# Patient Record
Sex: Female | Born: 1965 | Race: White | Hispanic: No | Marital: Married | Smoking: Never smoker
Health system: Southern US, Community
[De-identification: ages and names within clinical notes are randomized; demographics above are authoritative.]

## PROBLEM LIST (undated history)

## (undated) DIAGNOSIS — E7800 Pure hypercholesterolemia, unspecified: Secondary | ICD-10-CM

## (undated) DIAGNOSIS — M8580 Other specified disorders of bone density and structure, unspecified site: Secondary | ICD-10-CM

## (undated) HISTORY — DX: Other specified disorders of bone density and structure, unspecified site: M85.80

## (undated) HISTORY — PX: HEMORRHOIDECTOMY WITH HEMORRHOID BANDING: SHX5633

## (undated) HISTORY — DX: Pure hypercholesterolemia, unspecified: E78.00

## (undated) HISTORY — PX: HYSTEROSCOPY: SHX211

## (undated) HISTORY — PX: DILATION AND CURETTAGE OF UTERUS: SHX78

---

## 1997-12-23 ENCOUNTER — Ambulatory Visit (HOSPITAL_COMMUNITY): Admission: RE | Admit: 1997-12-23 | Discharge: 1997-12-23 | Payer: Self-pay | Admitting: *Deleted

## 1998-08-11 ENCOUNTER — Other Ambulatory Visit: Admission: RE | Admit: 1998-08-11 | Discharge: 1998-08-11 | Payer: Self-pay | Admitting: *Deleted

## 1998-09-29 ENCOUNTER — Ambulatory Visit (HOSPITAL_COMMUNITY): Admission: RE | Admit: 1998-09-29 | Discharge: 1998-09-29 | Payer: Self-pay | Admitting: *Deleted

## 1998-12-08 ENCOUNTER — Other Ambulatory Visit: Admission: RE | Admit: 1998-12-08 | Discharge: 1998-12-08 | Payer: Self-pay | Admitting: *Deleted

## 1999-03-23 ENCOUNTER — Other Ambulatory Visit: Admission: RE | Admit: 1999-03-23 | Discharge: 1999-03-23 | Payer: Self-pay | Admitting: *Deleted

## 1999-06-26 ENCOUNTER — Other Ambulatory Visit: Admission: RE | Admit: 1999-06-26 | Discharge: 1999-06-26 | Payer: Self-pay | Admitting: *Deleted

## 1999-06-26 ENCOUNTER — Encounter (INDEPENDENT_AMBULATORY_CARE_PROVIDER_SITE_OTHER): Payer: Self-pay

## 1999-11-20 ENCOUNTER — Other Ambulatory Visit: Admission: RE | Admit: 1999-11-20 | Discharge: 1999-11-20 | Payer: Self-pay | Admitting: *Deleted

## 2000-03-27 ENCOUNTER — Other Ambulatory Visit: Admission: RE | Admit: 2000-03-27 | Discharge: 2000-03-27 | Payer: Self-pay | Admitting: *Deleted

## 2000-04-04 ENCOUNTER — Encounter: Admission: RE | Admit: 2000-04-04 | Discharge: 2000-04-04 | Payer: Self-pay | Admitting: *Deleted

## 2000-04-04 ENCOUNTER — Encounter: Payer: Self-pay | Admitting: *Deleted

## 2000-07-10 ENCOUNTER — Other Ambulatory Visit: Admission: RE | Admit: 2000-07-10 | Discharge: 2000-07-10 | Payer: Self-pay | Admitting: *Deleted

## 2001-04-09 ENCOUNTER — Other Ambulatory Visit: Admission: RE | Admit: 2001-04-09 | Discharge: 2001-04-09 | Payer: Self-pay | Admitting: *Deleted

## 2001-07-29 ENCOUNTER — Other Ambulatory Visit: Admission: RE | Admit: 2001-07-29 | Discharge: 2001-07-29 | Payer: Self-pay | Admitting: *Deleted

## 2002-04-19 ENCOUNTER — Other Ambulatory Visit: Admission: RE | Admit: 2002-04-19 | Discharge: 2002-04-19 | Payer: Self-pay | Admitting: Obstetrics and Gynecology

## 2002-08-27 ENCOUNTER — Encounter (INDEPENDENT_AMBULATORY_CARE_PROVIDER_SITE_OTHER): Payer: Self-pay

## 2002-08-27 ENCOUNTER — Ambulatory Visit (HOSPITAL_COMMUNITY): Admission: RE | Admit: 2002-08-27 | Discharge: 2002-08-27 | Payer: Self-pay | Admitting: Obstetrics & Gynecology

## 2002-11-19 ENCOUNTER — Ambulatory Visit (HOSPITAL_COMMUNITY): Admission: RE | Admit: 2002-11-19 | Discharge: 2002-11-19 | Payer: Self-pay | Admitting: Obstetrics & Gynecology

## 2003-04-21 ENCOUNTER — Other Ambulatory Visit: Admission: RE | Admit: 2003-04-21 | Discharge: 2003-04-21 | Payer: Self-pay | Admitting: Obstetrics & Gynecology

## 2003-05-20 ENCOUNTER — Other Ambulatory Visit: Admission: RE | Admit: 2003-05-20 | Discharge: 2003-05-20 | Payer: Self-pay | Admitting: Obstetrics & Gynecology

## 2003-07-31 ENCOUNTER — Emergency Department (HOSPITAL_COMMUNITY): Admission: EM | Admit: 2003-07-31 | Discharge: 2003-07-31 | Payer: Self-pay | Admitting: Emergency Medicine

## 2004-01-10 ENCOUNTER — Encounter: Admission: RE | Admit: 2004-01-10 | Discharge: 2004-01-10 | Payer: Self-pay | Admitting: Family Medicine

## 2004-04-17 ENCOUNTER — Encounter: Admission: RE | Admit: 2004-04-17 | Discharge: 2004-04-17 | Payer: Self-pay | Admitting: Obstetrics & Gynecology

## 2005-05-16 ENCOUNTER — Encounter: Admission: RE | Admit: 2005-05-16 | Discharge: 2005-05-16 | Payer: Self-pay | Admitting: Obstetrics & Gynecology

## 2006-05-20 ENCOUNTER — Encounter: Admission: RE | Admit: 2006-05-20 | Discharge: 2006-05-20 | Payer: Self-pay | Admitting: Obstetrics & Gynecology

## 2007-05-25 ENCOUNTER — Encounter: Admission: RE | Admit: 2007-05-25 | Discharge: 2007-05-25 | Payer: Self-pay | Admitting: Obstetrics & Gynecology

## 2008-05-31 ENCOUNTER — Encounter: Admission: RE | Admit: 2008-05-31 | Discharge: 2008-05-31 | Payer: Self-pay | Admitting: Obstetrics & Gynecology

## 2008-06-21 ENCOUNTER — Ambulatory Visit: Payer: Self-pay | Admitting: Sports Medicine

## 2008-06-21 DIAGNOSIS — M461 Sacroiliitis, not elsewhere classified: Secondary | ICD-10-CM | POA: Insufficient documentation

## 2009-06-05 ENCOUNTER — Encounter: Admission: RE | Admit: 2009-06-05 | Discharge: 2009-06-05 | Payer: Self-pay | Admitting: Obstetrics & Gynecology

## 2009-09-27 ENCOUNTER — Ambulatory Visit: Payer: Self-pay | Admitting: Sports Medicine

## 2009-09-27 DIAGNOSIS — M5430 Sciatica, unspecified side: Secondary | ICD-10-CM | POA: Insufficient documentation

## 2009-09-27 DIAGNOSIS — M79609 Pain in unspecified limb: Secondary | ICD-10-CM | POA: Insufficient documentation

## 2009-10-26 ENCOUNTER — Ambulatory Visit: Payer: Self-pay | Admitting: Sports Medicine

## 2010-01-10 ENCOUNTER — Ambulatory Visit: Payer: Self-pay | Admitting: Sports Medicine

## 2010-04-12 ENCOUNTER — Ambulatory Visit: Payer: Self-pay | Admitting: Sports Medicine

## 2010-06-07 ENCOUNTER — Encounter
Admission: RE | Admit: 2010-06-07 | Discharge: 2010-06-07 | Payer: Self-pay | Source: Home / Self Care | Attending: Obstetrics & Gynecology | Admitting: Obstetrics & Gynecology

## 2010-08-02 NOTE — Assessment & Plan Note (Signed)
Summary: F/U HAMSTRING,MC   Vital Signs:  Patient profile:   45 year old female Height:      56 inches Weight:      135 pounds BMI:     30.38 BP sitting:   123 / 83  Vitals Entered By: Lillia Pauls CMA (September 27, 2009 11:44 AM)  History of Present Illness: Reports to f/u sacroiliitis, which improved by 80%. Started performing quad extensions and stair climber exercises this past winter. Noted insidious onset of proximal medial left buttock pain overlying proximal HS. Pain occurs with radiation down posterior left leg toward calf. Leg pain not associated with back pain. Regularly performs ITB stretches. Runs 3-5 miles twice weekly.  Pain early during runs, moreson when running uphill, which worsens until she stops. Pain on prolonged sitting and during swimming as well. No incontinence or paresthesias.  Allergies (verified): No Known Drug Allergies  Physical Exam  General:  Well-developed,well-nourished,in no acute distress; alert,appropriate and cooperative throughout examination Msk:  BACK: FROM at the waist w/o pain. No ttp.  BUTTOCKS: Mid left gluteal ttp with similar ttp at sciatic notch. No apparent swelling or defect.  HIPS: No ttp. Full ROM.  Decreased 4/5 ER/abd/add strength on left. (-) FABER on left. Increased left hip flexibility vs last exam.  KNEES/ANKLES/FEET: Full ROM/strength. Reproduced pain on prolonged left hamstring loading.  * Good running form, though slight ER of the left foot. Pulses:  Normal distal pulses. Neurologic:  (-) SLR bilaterally.   Impression & Recommendations:  Problem # 1:  LEG PAIN, LEFT (ICD-729.5) Picture most c/w piriformis syndrome, with underlying hamstring or quadratus femorus tendinopathy as possible contributants.  - Daily crossover stretch, piriformis stretch, hamstring exercises with weights, and lunges as tolerated. - Run as tolerated on level surfaces. Walk along hills. - Start neurontin. Cautioned re:  potential adverse medication effects. - RTC in 1 month.  Problem # 2:  SACROILIITIS NOT ELSEWHERE CLASSIFIED (ICD-720.2) Assessment: Improved  - Per item #1. - Can continue to swim, but avoid butterflies and backstrokes which contributed to hyperextension of the back.  Problem # 3:  SCIATICA, LEFT (ICD-724.3) This is probably 2/2 piriformis unable to be sure if this was high hamnstring, quad femoris or piriformis on exam did not seem related to lumbar pathology  Complete Medication List: 1)  Neurontin 300 Mg Caps (Gabapentin) .Marland Kitchen.. 1 cap by mouth qhs Prescriptions: NEURONTIN 300 MG CAPS (GABAPENTIN) 1 cap by mouth qHS  #30 x 0   Entered and Authorized by:   Valarie Merino MD   Signed by:   Valarie Merino MD on 09/27/2009   Method used:   Print then Give to Patient   RxID:   [id number] NEURONTIN 300 MG CAPS (GABAPENTIN) 1 cap by mouth qHS  #30 x 0   Entered and Authorized by:   Valarie Merino MD   Signed by:   Valarie Merino MD on 09/27/2009   Method used:   Print then Give to Patient   RxID:   [id number]

## 2010-08-02 NOTE — Assessment & Plan Note (Signed)
Summary: F/U,MC   Vital Signs:  Patient profile:   45 year old female Temp:     61 degrees F Pulse rate:   61 / minute BP sitting:   119 / 73  (right arm)  Vitals Entered By: Rochele Pages RN (April 12, 2010 9:35 AM) CC: f/u L hamstring and sciatica   CC:  f/u L hamstring and sciatica.  History of Present Illness: Patient returns to clinic today for follow up on left hamstring pain and sciatica which she reports are 30% improved from her last visit. She still has pain and tingling with prolonged sitting, especially on harder surfaces like movie or car seats. The tingling runs down the lateral side of her leg. She denies numbness.  She has occasional pain with her hamstring with activity. She has resumed swimming and running several times a week without problems, but can no longer do isolated kicking exercises in the pool. She has also continued her stretching and rehab exercises.  She is still on gabapentin twice daily. She tried one day not taking it at night and was uncomfortable the following day.  Allergies: No Known Drug Allergies  Physical Exam  General:  Well-developed,well-nourished,in no acute distress; alert,appropriate and cooperative throughout examination Msk:  Hip: Full ROM with flexion, extension.  5/5 strength bilaterally with flexion, extension, abduction. Mild weakness on L as compared to right with adduction.   FABER improved on R, close to equal bilaterally.  Pretzel stretch with decreased motion at SI joint and tightness on L as compared to R. Neurologic:  Running gait neutral with equal swing of legs.   Impression & Recommendations:  Problem # 1:  SCIATICA, LEFT (ICD-724.3) Sciatica improved. Instructed on pretzel stretch to loosen SI joint on L. Will continue exercises and add adductor exercise on L. Continue gabapentin two times a day, but she will try titrating dose as needed based on activity.  Can reck in 3 to 4 mos as needed  Problem # 2:  LEG  PAIN, LEFT (ICD-729.5) Pain improved and hamstring strength good. Will avoid static stretching before workouts to avoid irritation. Continue exercise and rehab exercises.  Complete Medication List: 1)  Neurontin 300 Mg Caps (Gabapentin) .Marland Kitchen.. 1 cap by mouth tid Prescriptions: NEURONTIN 300 MG CAPS (GABAPENTIN) 1 cap by mouth TID  #90 x 3   Entered and Authorized by:   Enid Baas MD   Signed by:   Enid Baas MD on 04/12/2010   Method used:   Electronically to        [facility] [address] (retail)       [address]       [place], [place]  [address]       Ph: [phone] or [phone]       Fax: [phone]   RxID:   [id number]

## 2010-08-02 NOTE — Assessment & Plan Note (Signed)
Summary: F/U,MC   Vital Signs:  Patient profile:   45 year old female BP sitting:   111 / 67  Vitals Entered By: Lillia Pauls CMA (January 10, 2010 8:47 AM)  History of Present Illness: Running - feels she is doing some better  Pain has improved with 2 gabapentin per day  sensation of pressure over sciatic n when sitting too much  worse p hard w swim workout  feels strong  running 3 to 5 mi 3 to 4 x per wk swims 3 x per wk - 3000 M and intervals  Allergies: No Known Drug Allergies  Physical Exam  General:  Well-developed,well-nourished,in no acute distress; alert,appropriate and cooperative throughout examination Msk:  very tiught on FABER on RT when compared to left good hip rotation  good strength bilat hips good hamstring strength bilat  left - no sciatic sxs on  pressure over HS of quad femoris  neg SLR  running gait shows some turnout of RT foot and less swing and motion of RT SIJ and pelvis   Impression & Recommendations:  Problem # 1:  LEG PAIN, LEFT (ICD-729.5) much less and able to train at fairly high level  OK to consider ART or accupuncture if she wishes to try rolfing did not help in past  Problem # 2:  SCIATICA, LEFT (ICD-724.3) gabapentin has clearly helped would keep this at two times a day since sxs are not that severe x p hard workouts  reck 3 mos  Complete Medication List: 1)  Neurontin 300 Mg Caps (Gabapentin) .Marland Kitchen.. 1 cap by mouth tid  Patient Instructions: 1)  Plan on reck in 3 mos 2)  easy stretch to RT side 3)  keep up other exercises 4)  leave gabaopentin to 2 per day except bad days increase to 3

## 2010-08-02 NOTE — Assessment & Plan Note (Signed)
Summary: F/U HAMSTRING,MC   Vital Signs:  Patient profile:   45 year old female BP sitting:   114 / 70  Vitals Entered By: Lillia Pauls CMA (October 26, 2009 9:56 AM)  History of Present Illness: Follow up of Left hip and sciatic issues  Hamstring and sciatic sxs better w running stronger sitting is still a problem pain in buttocks pain down to HS and sometimes below knee  neurontin no real sideeffects later in day when working/ sits at computer or with travel gets more sciatic sxs  Allergies: No Known Drug Allergies  Physical Exam  General:  Well-developed,well-nourished,in no acute distress; alert,appropriate and cooperative throughout examination Msk:  now has good SIJ mobility on testing  norm HIP ROM goos strength in the core  left leg still feels some discomfort on conc HS strength test at max good at 30 deg and 10 deg  strength is 95% of RT  Piriformis is now non tender on left   Impression & Recommendations:  Problem # 1:  LEG PAIN, LEFT (ICD-729.5) Assessment Improved definitely improved  running steadily better  Problem # 2:  SCIATICA, LEFT (ICD-724.3) Assessment: Improved improved but we should push dose a bit to see if we can get more relief  Problem # 3:  SACROILIITIS NOT ELSEWHERE CLASSIFIED (ICD-720.2) Good SIJ mobility now  still would avoid some of the swim strokes that aggravate  will reck in 2 to 3 mos  Complete Medication List: 1)  Neurontin 300 Mg Caps (Gabapentin) .Marland Kitchen.. 1 cap by mouth tid  Patient Instructions: 1)  With gabapentin try at least two times a day for 1 week 2)  If not bad sideeffects can titrate two times a day to three times a day 3)  keep up at least 1 set of HS exercises but increase range of motion on those 4)  keep up standing hip rotation at least 1 set 5)  pick stretches that help - don't overdo on RT particularly 6)  2 to 3 months return Prescriptions: NEURONTIN 300 MG CAPS (GABAPENTIN) 1 cap by mouth TID   #90 x 3   Entered by:   Lillia Pauls CMA   Authorized by:   Enid Baas MD   Signed by:   Lillia Pauls CMA on 10/26/2009   Method used:   Electronically to        [facility]* (retail)       [address]       [place], [place]  [address]       Ph: [phone]       Fax: [phone]   RxID:   [id number]

## 2010-09-21 ENCOUNTER — Other Ambulatory Visit: Payer: Self-pay | Admitting: Obstetrics & Gynecology

## 2010-10-27 ENCOUNTER — Other Ambulatory Visit: Payer: Self-pay | Admitting: Sports Medicine

## 2010-11-16 NOTE — Op Note (Signed)
   NAME:  Danielle [name], Danielle [name]                        ACCOUNT NO.:  [id number]   MEDICAL RECORD NO.:  [id number]                   PATIENT TYPE:  AMB   LOCATION:  [facility]                                  FACILITY:  [facility]   PHYSICIAN:  Genia Del, M.D.             DATE OF BIRTH:  [birth year]   DATE OF PROCEDURE:  11/19/2002  DATE OF DISCHARGE:                                 OPERATIVE REPORT   PREOPERATIVE DIAGNOSES:  1. Refractory menorrhagia with normal intrauterine cavity.  2. 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   POSTOPERATIVE DIAGNOSES:  1. Refractory menorrhagia with normal intrauterine cavity.  2. 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   PROCEDURE:  Cryoablation of the endometrium with Her Option.   SURGEON:  Genia Del, M.D.   ANESTHESIOLOGIST:  John A. Maisie Fus, M.D.   DESCRIPTION OF PROCEDURE:  Under MAC analgesia, the patient is in lithotomy  position.  She was prepped with Hibiclens in the suprapubic, vulvar, and  vaginal areas and draped as usual.  The vaginal exam revealed an anteverted  uterus, normal volume, mobile, no adnexal mass.  The cervix is normal to  palpation.  The speculum is inserted.  The paracervical block is done with  lidocaine 1% a total of 20 mL at 4 and 8 o'clock.  The tenaculum is applied  on the anterior lip of the cervix.  Dilatation with Hegar dilators is done  easily to a #21.  We then use Her Option to freeze first the left side of  the intrauterine cavity to a depth of 10 cm over eight minutes, reaching a  temperature of -99.  We proceed the same way on the right side over eight  minutes at -99 and then we decide to do a third freeze because of the depth  of the cavity in the midline over six minutes at -99 degrees.  We remove the  probe in between each freeze as per protocol.  The tenaculum is then removed  from the anterior lip of the cervix.  Silver nitrate is applied at that  level to control hemostasis.  A bladder catheterization is done to empty the  bladder at the end of the procedure.  There was minimal blood loss, no  complication occurred, and the patient was transferred to the recovery room  in good status.  Note that the patient prepared her cavity by being on Aleve  prior to the surgery.                                               Genia Del, M.D.    ML/MEDQ  D:  11/19/2002  T:  11/19/2002  Job:  [id number]

## 2010-11-16 NOTE — Op Note (Signed)
   NAME:  [name], Danielle [name]                        ACCOUNT NO.:  [id number]   MEDICAL RECORD NO.:  [id number]                   PATIENT TYPE:  AMB   LOCATION:  [facility]                                  FACILITY:  [facility]   PHYSICIAN:  Genia Del, M.D.             DATE OF BIRTH:  May 26, 1966   DATE OF PROCEDURE:  08/27/2002  DATE OF DISCHARGE:                                 OPERATIVE REPORT   PREOPERATIVE DIAGNOSES:  1. Menorrhagia.  2. Intrauterine lesions per ultrasound.   POSTOPERATIVE DIAGNOSES:  1. Menorrhagia.  2. Intrauterine lesions per ultrasound.  3. Possible endometrial polyp.   INTERVENTION:  1. Hysteroscopy with resection of possible polyp.  2. Dilatation and curettage.   SURGEON:  Genia Del, M.D.   ANESTHESIOLOGIST:  Burnett Corrente, M.D.   PROCEDURE:  Under general anesthesia with endotracheal intubation the  patient is in lithotomy position.  She is prepped with Hibiclens on the  suprapubic, vulvar, and vaginal area.  The bladder is catheterized and the  patient is draped as usual.  The vaginal examination under general  anesthesia reveals an anteverted uterus normal volume, mobile and no adnexal  mass.  The cervix is long and closed.  No bleeding is present.  The weighted  speculum is inserted.  We then grasp the anterior lip of the cervix with a  tenaculum.  A paracervical block is done at 4 and 8 o'clock with a total of  20 mL of lidocaine 1%.  We then proceed with dilatation with Hegar dilators  up to number 37 without difficulty.  The operative hysteroscope is then  introduced inside the uterine cavity.  We visualize the intrauterine cavity  which appears normal except for a thick endometrium and possible anterior  endometrial polyp.  Both ostia are visualized.  Pictures are taken.  Using  the loop we resect the possible polyp on the anterior wall of the  intrauterine cavity and it is sent to pathology.  We then remove the  hysteroscope and proceed  with systematic curettage of the endometrial cavity  with a sharp curette.  Endometrial curettings are removed in moderate  quantity and sent to pathology.  Hemostasis is adequate.  We remove the  clamp on the anterior lip of the cervix, complete hemostasis at that level  with silver nitrate,  and remove the weighted speculum.  The estimated blood  loss was minimal.  No complications occurred and the patient was brought to  recovery room in good status.                                               Genia Del, M.D.    ML/MEDQ  D:  08/27/2002  T:  08/27/2002  Job:  [id number]

## 2011-04-23 ENCOUNTER — Encounter: Payer: Self-pay | Admitting: Family Medicine

## 2011-04-23 ENCOUNTER — Ambulatory Visit (INDEPENDENT_AMBULATORY_CARE_PROVIDER_SITE_OTHER): Payer: Federal, State, Local not specified - PPO | Admitting: Family Medicine

## 2011-04-23 VITALS — BP 121/76 | HR 61

## 2011-04-23 DIAGNOSIS — M722 Plantar fascial fibromatosis: Secondary | ICD-10-CM

## 2011-04-23 NOTE — Progress Notes (Signed)
  Subjective:    Patient ID: Danielle [name], female    DOB: February 22, 1966, 45 y.o.   MRN: [id number]  HPI Several weeks of left foot pain. Started after she switched her running shoes. Went to a more minimalist shoe. Runs about 15 miles a week. Is mostly a swimmer and does multiple some workouts per week. Pain in left foot is worse with first depth after sitting down for a long time or for step in the morning. It is sharp. He gets some there was activity in returns after she has rested. No numbness in the toes. No specific injury. It does temporally related to the time she switched shoes.   Review of Systems No unusual weight change.    Objective:   Physical Exam  GENERAL: Well-developed female no acute distress Gait: Normal stride length. She is a midfoot to forefoot [place]ryker. DATE: Normal to high longitudinal arch. Preserved transverse arch. Tender to palpation over the origin of the plantar fascia on the left foot. It is most tender at the origin not really along the plantar fascia as it moves distally. The foot is neurovascularly intact. INJECTION: Patient was given informed consent, signed copy in the chart. Appropriate time out was taken. Area prepped and draped in usual sterile fashion. One cc of methylprednisolone 40 mg for female plus  4 cc of lidocaine was injected into the area above the plantar fascia of the left foot using a(n) lateral approach. The patient tolerated the procedure well. There were no complications. Post procedure instructions were given.       Assessment & Plan:  Acute traumatic plantar fasciitis.  We discussed options including extremely conservative which would be resting and changing shoes. Versus NTG patch and patch and changing shoes. Ultimately she chose changing shoes and corticosteroid injection. She does not totally resolved I would add a nitroglycerin patch. She'll let me know. We discussed shoe wear.

## 2011-05-03 ENCOUNTER — Other Ambulatory Visit: Payer: Self-pay | Admitting: Obstetrics & Gynecology

## 2011-05-03 DIAGNOSIS — Z1231 Encounter for screening mammogram for malignant neoplasm of breast: Secondary | ICD-10-CM

## 2011-06-11 ENCOUNTER — Ambulatory Visit
Admission: RE | Admit: 2011-06-11 | Discharge: 2011-06-11 | Disposition: A | Discharge status: Home / Self Care | Payer: Federal, State, Local not specified - PPO | Source: Ambulatory Visit | Attending: Obstetrics & Gynecology | Admitting: Obstetrics & Gynecology

## 2011-06-11 DIAGNOSIS — Z1231 Encounter for screening mammogram for malignant neoplasm of breast: Secondary | ICD-10-CM

## 2011-06-17 ENCOUNTER — Ambulatory Visit (INDEPENDENT_AMBULATORY_CARE_PROVIDER_SITE_OTHER): Payer: Federal, State, Local not specified - PPO | Admitting: Family Medicine

## 2011-06-17 VITALS — BP 110/70

## 2011-06-17 DIAGNOSIS — M722 Plantar fascial fibromatosis: Secondary | ICD-10-CM

## 2011-06-17 DIAGNOSIS — M7520 Bicipital tendinitis, unspecified shoulder: Secondary | ICD-10-CM

## 2011-06-17 DIAGNOSIS — S93699A Other sprain of unspecified foot, initial encounter: Secondary | ICD-10-CM

## 2011-06-17 MED ORDER — NITROGLYCERIN 0.2 MG/HR TD PT24: MEDICATED_PATCH | TRANSDERMAL | Status: DC

## 2011-06-17 NOTE — Patient Instructions (Signed)
Cut patch into one - fourth pieces. Place a one fourth piece of patch on  skin over affected area, changing to a new piece every 24 hours. If you have headache, give [place] a call and stop the patch. I am also giving you some arch straps to try. See me in 4-6 weeks.  For the shoulder, do regular bicep curls, 15-30 per set, 2 sets every other day. Ice if it makes this mor sore. If it is not getting better or it gets worse in next few weeks, give me a call  And we can consider an injection.

## 2011-06-20 DIAGNOSIS — S93699A Other sprain of unspecified foot, initial encounter: Secondary | ICD-10-CM | POA: Insufficient documentation

## 2011-06-20 DIAGNOSIS — M7520 Bicipital tendinitis, unspecified shoulder: Secondary | ICD-10-CM | POA: Insufficient documentation

## 2011-06-20 NOTE — Progress Notes (Signed)
  Subjective:    Patient ID: Danielle [name], female    DOB: Jul 04, 1965, 45 y.o.   MRN: [id number]  HPI  #1. Followup left plantar fasciitis. The injection helped for about a week. Since then she's had no improvement. She continues to have the same pain in the bottom portion of her heel worse with first and the morning. Has been doing some stretching exercises which he'll be at a time but not seem to help it go away. Her right foot is without pain. She has not done any running since I saw her. She has continued to swim. #2. Left shoulder pain that started 2-3 weeks ago. She was doing the back stroke in the pool and she felt a sharp pain in her left shoulder but was able to continue to work out. After that she experienced intermittent times when it was particularly painful with movement. She had a little bit of numbness radiating into her hand intermittently as well. He has bothered her on several occasions since then with swimming.  PERTINENT  PMH / PSH: No prior history of left shoulder injury or surgery. Right hand dominant Review of Systems    no unusual weight change. No fever, sweats, chills.  Objective:   Physical Exam  Vital signs reviewed. GENERAL: Well developed, well nourished, no acute distress FOOT: Left. Tender to palpation at the origin of the plantar fascia. Mild pes planus. Neurovascularly intact to soft touch sensation was normal cap refill. SHOULDER: Left shoulder for range of motion. Intact strength in all planes of the rotator cuff. Mild tenderness to palpation over the proximal biceps tendon and this reproduces her pain. ULTRASOUND: Left shoulder reveals intact rotator cuff muscles. Good articular cartilage Seen without any deformity. There is no effusion. One or 2 very small calcifications in the supraspinatus. The biceps tendon appears normal. The bicipital groove is somewhat shallow non-dynamic testing I do not see movement of the tendon out of the groove. There is no  fluid around the tendon.      Assessment & Plan:  #1. Plantar fasciitis traumatic left foot. We will give her arch strep. Continue exercises. Start nitroglycerin patch no seizure back for 6 weeks. No activity limits. #2. Mild bicipital tendinitis. Home exercise program given. This does not improve she'll let me know. I not think she is at the point now where we would consider injection that were it not improve with strengthening that would be an option.

## 2011-06-30 ENCOUNTER — Other Ambulatory Visit: Payer: Self-pay | Admitting: Sports Medicine

## 2011-07-19 ENCOUNTER — Ambulatory Visit: Payer: Federal, State, Local not specified - PPO | Admitting: Family Medicine

## 2011-07-22 ENCOUNTER — Ambulatory Visit (INDEPENDENT_AMBULATORY_CARE_PROVIDER_SITE_OTHER): Payer: Federal, State, Local not specified - PPO | Admitting: Family Medicine

## 2011-07-22 DIAGNOSIS — M722 Plantar fascial fibromatosis: Secondary | ICD-10-CM

## 2011-07-22 DIAGNOSIS — S93699A Other sprain of unspecified foot, initial encounter: Secondary | ICD-10-CM

## 2011-07-22 DIAGNOSIS — M7520 Bicipital tendinitis, unspecified shoulder: Secondary | ICD-10-CM

## 2011-07-23 ENCOUNTER — Other Ambulatory Visit: Payer: Self-pay | Admitting: *Deleted

## 2011-07-23 NOTE — Progress Notes (Signed)
  Subjective:    Patient ID: Danielle [name], female    DOB: Dec 01, 1965, 46 y.o.   MRN: [id number]  HPI  #1. Followup plantar fasciitis. Started a nitroglycerin patch and she is 50% improved. She has not been running. #2. Followup left shoulder. About 75% improved. Still having some popping but the pain is much better. 2 new meds that make it pop and cause some pain include downward push to take off garments of clothing like her sports bra. When she does this she feels pain in the posterior shoulder. When she is swimming and makes a left-handed turn she notes some popping and occasionally will have pain then. No new symptoms. No hand numbness.  Review of Systems    no fever, sweats, chills. Objective:   Physical Exam  Vital signs reviewed. GENERAL: Well developed, well nourished, no acute distress Shoulder, left full range of motion in all planes of the rotator cuff. No pain with clot of the humeral head. There is still some mild pain with axial loading with posterior force against the capsule. The biceps tendon is still very mildly tender to palpation but much improved. Plantar Fascia decreased tenderness compared with prior exam.      Assessment & Plan:  #1 chronic plantar fasciitis improved with nitroglycerin patch. We'll plan to go for another 6-8 weeks using the patch. I would clear her to go ahead and return to her running activities and see how this works for her. E. she's in decreasing in pain after she returned to running she will call us. #2. Left shoulder pain. Still some popping but significant improvement in pain. We'll have her continue exercise regimen and followup as above.

## 2011-09-16 ENCOUNTER — Encounter: Payer: Self-pay | Admitting: Family Medicine

## 2011-09-16 ENCOUNTER — Ambulatory Visit (INDEPENDENT_AMBULATORY_CARE_PROVIDER_SITE_OTHER): Payer: Federal, State, Local not specified - PPO | Admitting: Family Medicine

## 2011-09-16 VITALS — BP 109/72 | HR 59

## 2011-09-16 DIAGNOSIS — M722 Plantar fascial fibromatosis: Secondary | ICD-10-CM

## 2011-09-16 DIAGNOSIS — S93699A Other sprain of unspecified foot, initial encounter: Secondary | ICD-10-CM

## 2011-09-17 NOTE — Progress Notes (Signed)
  Subjective:    Patient ID: Danielle [name], female    DOB: [birth year], 46 y.o.   MRN: [id number]  HPI  #1. Follow up left plantar fasciitis. She has been using the nitroglycerin Dur patch now for about 10 weeks. Initially she got some fairly significant improvement up to about 50 or 60%. She has plateaued at that improvement. She has been faithful using the patch and is having no side effects from it. She did try running and was able to run 1.7 miles without pain however the next day she noted her myofascial pain was worse. She is quite frustrated. #2. Left shoulder pain. The anterior part of the arm that was hurting, bicipital tendinitis, seems much improved. She still occasionally has a catch in the posterior shoulder. She particularly notices this in the evening when she tried to take her clothing on. She does not have problems while she is exercising. She is not doing a lot of regular stretching.  Review of Systems Denies fever, sweats, chills.    Objective:   Physical Exam  Vital signs are reviewed GENERAL: Well-developed female no acute distress SHOULDER: Left shoulder full range of motion. There is slight pain on axial loading noted in the posterior capsular area. She has full strength in all planes of the rotator cuff. FOOT: Left. Still tender to palpation at the origin of the plantar fascia. There is no other heel tenderness. There is no erythema, warmth or skin lesion of the foot.   INJECTION: Patient was given informed consent, signed copy in the chart. Appropriate time out was taken. Area prepped and draped in usual sterile fashion. 1 cc of methylprednisolone 40 mg/ml plus  1.5 cc of 1% lidocaine without epinephrine was injected into the left foot area above the origin of the plantar fascia using a(n) perpendicular approach. The patient tolerated the procedure well. There were no complications. Post procedure instructions were given.     Assessment & Plan:  #1. Recalcitrant  plan fasciitis. Long discussion. I'm sure she is frustrated. We discussed options and decided today to do a second corticosteroid injection. We'll keep her off the patch for 2 days then she'll return to using it for 2 more weeks. At the end of that time to stop the patch and call with an update. #2. Regarding her shoulder issues I think the bicipital tendinitis has  virtually resolved. It is likely she has some posterior capsular fraying given her long history of swimming. I would recommend some stretching not necessarily with exercise but more importantly when she's not exercising. I demonstrated posterior capsular stretch in the lateral and forward flexed positions.

## 2011-10-11 ENCOUNTER — Encounter: Payer: Federal, State, Local not specified - PPO | Admitting: Family Medicine

## 2011-10-12 ENCOUNTER — Other Ambulatory Visit: Payer: Self-pay | Admitting: Family Medicine

## 2011-10-21 ENCOUNTER — Ambulatory Visit (INDEPENDENT_AMBULATORY_CARE_PROVIDER_SITE_OTHER): Payer: Federal, State, Local not specified - PPO | Admitting: Family Medicine

## 2011-10-21 VITALS — BP 99/65

## 2011-10-21 DIAGNOSIS — M722 Plantar fascial fibromatosis: Secondary | ICD-10-CM

## 2011-10-21 DIAGNOSIS — S93699A Other sprain of unspecified foot, initial encounter: Secondary | ICD-10-CM

## 2011-10-21 DIAGNOSIS — M7520 Bicipital tendinitis, unspecified shoulder: Secondary | ICD-10-CM

## 2011-10-22 NOTE — Progress Notes (Signed)
  Subjective:    Patient ID: KAIAH HOSEA, female    DOB: 1966-06-14, 46 y.o.   MRN: [id number]  HPI  1. F/u plantar fasciitis---overall she is about 905 better. Has continued the nitrglycerin patch. Has been running---has some intermittent pain--not necessarily after running or even day after but sometimes in a day or two. Usually self resolves--when she has it teh pain is mild.  Not back up to where she wants her mileage to be yet. Has been taking it slowly. 2. Left shoulderpain---much better as well. Has been doing teh stretches and also has cut out doing teh back stroke--also 90-96% better.  Review of Systems Pertinent review of systems: negative for fever or unusual weight change.     Objective:   Physical Exam   Vital signs reviewed. GENERAL: Well developed, well nourished, no acute distress LEFT shoulder--FROm--painless Rotator cuff muscles strength intact in all palnes Plantar fasciitis is nont tender to palpatin. Foot is slightly high arched but will loss of transvers arch    Assessment & Plan:  1. Shoulder pain---improved 2. Plantar fasciitis---significantly improved--will let her experiment with running more mileage--can stop patch or continue as she builds up mileage. I did give her an xsmall MT pad for her left shoe. She will see if that helps things.

## 2011-12-23 ENCOUNTER — Ambulatory Visit (INDEPENDENT_AMBULATORY_CARE_PROVIDER_SITE_OTHER): Payer: Federal, State, Local not specified - PPO | Admitting: Family Medicine

## 2011-12-23 VITALS — BP 122/73 | HR 56

## 2011-12-23 DIAGNOSIS — M461 Sacroiliitis, not elsewhere classified: Secondary | ICD-10-CM

## 2011-12-23 MED ORDER — DICLOFENAC SODIUM 75 MG PO TBEC: 75.0000 mg | DELAYED_RELEASE_TABLET | Freq: Two times a day (BID) | ORAL | Status: AC

## 2011-12-23 MED ORDER — CYCLOBENZAPRINE HCL 5 MG PO TABS: 5.0000 mg | ORAL_TABLET | Freq: Two times a day (BID) | ORAL | Status: AC | PRN

## 2011-12-23 NOTE — Progress Notes (Signed)
Patient ID: Danielle [name], female   DOB: 30-Jul-1965, 46 y.o.   MRN: [id number]  HPI:  Date of injury December 21, 2011. Left buttock spasm: After putting a 12 pound hand weight down,. Notably she did have to lean forward with her left leg primarily supporting her incontinence angle around a piece of furniture to place to wait on the floor. She immediately felt a left buttock spasm that was totally incapacitating. She reports she had uterine husband to come into the gym and help her out. She went home and took some NSAIDs in one of her husbands Flexeril which helped a little bit. The next morning when she woke up pain was severe. She tried to hobble around most of the day and ultimately took another Flexeril last night. She woke up today the pain was not improved. Is still spasm and cramping. It hurts to bear weight. She's not noting any numbness or tingling in her leg. The pain starts in the left buttock and radiates down into both the anterior and posterior thigh. Any twisting sensation makes it worse.  PERTINENT  PMH / PSH: History of sacroiliitis in the past. REVIEW of systems: Denies unusual weight change, denies fever, sweats, chills. No incontinence of bowel or bladder.  EXAM: Mildly tender to deep palpation over the left sacroiliac joint. Positive pain with Pearlean Brownie on the left. Radiating the left leg 5 foot out of the 45 angle and then twisting to the right also causes severe pain. Straight leg raise negative bilaterally. Lower sternum strength is 5 out of 5.

## 2011-12-24 NOTE — Assessment & Plan Note (Signed)
Acute sacroiliac strain. Unclear why she would have recurrent issues with her SI joints. He is in tremendous amount of pain today. We discussed conservative treatment which includes continued nonsteroidal medications as well as Flexeril, activity modification. She also opted for corticosteroid injection today. She'll it me know in the next 2 days how she is doing by phone.  INJECTION: Patient was given informed consent, signed copy in the chart. Appropriate time out was taken. Area prepped and draped in usual sterile fashion. 1 cc of methylprednisolone 40 mg/ml plus  3 cc of 1% lidocaine without epinephrine was injected into the left sacroiliac joint using a(n) posterior approach. The patient tolerated the procedure well. There were no complications. Post procedure instructions were given.

## 2012-05-07 ENCOUNTER — Other Ambulatory Visit: Payer: Self-pay | Admitting: Obstetrics & Gynecology

## 2012-05-07 DIAGNOSIS — Z1231 Encounter for screening mammogram for malignant neoplasm of breast: Secondary | ICD-10-CM

## 2012-06-12 ENCOUNTER — Ambulatory Visit
Admission: RE | Admit: 2012-06-12 | Discharge: 2012-06-12 | Disposition: A | Discharge status: Home / Self Care | Payer: Federal, State, Local not specified - PPO | Source: Ambulatory Visit | Attending: Obstetrics & Gynecology | Admitting: Obstetrics & Gynecology

## 2012-06-12 DIAGNOSIS — Z1231 Encounter for screening mammogram for malignant neoplasm of breast: Secondary | ICD-10-CM

## 2012-09-25 ENCOUNTER — Ambulatory Visit: Payer: Federal, State, Local not specified - PPO | Admitting: Family Medicine

## 2012-10-02 ENCOUNTER — Encounter: Payer: Self-pay | Admitting: Family Medicine

## 2012-10-02 ENCOUNTER — Ambulatory Visit (INDEPENDENT_AMBULATORY_CARE_PROVIDER_SITE_OTHER): Payer: Federal, State, Local not specified - PPO | Admitting: Family Medicine

## 2012-10-02 VITALS — BP 110/73 | HR 61 | Ht 65.0 in | Wt 132.0 lb

## 2012-10-02 DIAGNOSIS — M659 Synovitis and tenosynovitis, unspecified: Secondary | ICD-10-CM

## 2012-10-02 NOTE — Progress Notes (Signed)
  Subjective:    Patient ID: Danielle [name], female    DOB: [birth year], 47 y.o.   MRN: [id number]  HPI  Left anterior lower leg pain. Has had this off and on for several years but used to not very significant. Over the last 3-4 weeks she's noticed it almost daily. Has been doing less running and more stationary biking, that is spinning. Will notice toward the end of the spin class that she has numbness and burning here. When she's walking sometimes it feels like her anterior left lower leg is non-and she has had occasionally problems with accidentally kicking off her clogs.  Review of Systems Denies toe or foot numbness except while biking sometimes she'll have great toe numbness. No lower extremity weakness other than that described in history of present illness. No fever, sweats, chills.    Objective:   Physical Exam  Medicines are reviewed GENERAL: Well-developed female no acute distress Gait: Very slight amount of foot drop on the left. Also on the left she has some toe out on for planned. Otherwise it's normal gait. She is arm marginally worn down more on the lateral view bilaterally. LEGS: Left. Mildly tender to palpation along the anterior tibialis muscle dorsiflexion and plantar flexion strength is normal. Ankles bilaterally are symmetric. Leg length 90 cm bilaterally. ; Carollee Herter looks normal without any sign of stress reaction. I see no muscle disruption in the anterior tibialis.      Assessment & Plan:  #1. Anterior tibialis tenosynovitis. Placed a lateral wedge on her left heel. We'll have her do some dorsiflexion exercises. Ice after spinning. Discussed ways to place her foot in the pedal for cycling so she avoids straining. We'll also start the nitroglycerin patch which she has used before and she'll followup 4 weeks if not improving

## 2013-05-07 ENCOUNTER — Other Ambulatory Visit: Payer: Self-pay

## 2013-05-07 DIAGNOSIS — Z1231 Encounter for screening mammogram for malignant neoplasm of breast: Secondary | ICD-10-CM

## 2013-05-21 ENCOUNTER — Encounter: Payer: Self-pay | Admitting: Family Medicine

## 2013-05-21 ENCOUNTER — Ambulatory Visit (INDEPENDENT_AMBULATORY_CARE_PROVIDER_SITE_OTHER): Payer: Federal, State, Local not specified - PPO | Admitting: Family Medicine

## 2013-05-21 VITALS — BP 110/66 | Ht 65.0 in | Wt 135.0 lb

## 2013-05-21 DIAGNOSIS — Z5189 Encounter for other specified aftercare: Secondary | ICD-10-CM

## 2013-05-21 DIAGNOSIS — M79609 Pain in unspecified limb: Secondary | ICD-10-CM

## 2013-05-21 DIAGNOSIS — S86812D Strain of other muscle(s) and tendon(s) at lower leg level, left leg, subsequent encounter: Secondary | ICD-10-CM

## 2013-05-21 DIAGNOSIS — M6289 Other specified disorders of muscle: Secondary | ICD-10-CM

## 2013-05-21 DIAGNOSIS — M6580 Other synovitis and tenosynovitis, unspecified site: Secondary | ICD-10-CM

## 2013-05-21 DIAGNOSIS — M79669 Pain in unspecified lower leg: Secondary | ICD-10-CM

## 2013-05-21 MED ORDER — METHYLPREDNISOLONE ACETATE 40 MG/ML IJ SUSP
40.0000 mg | Freq: Once | INTRAMUSCULAR | Status: AC
  Administered 2013-05-21: 40 mg via INTRALESIONAL

## 2013-05-21 NOTE — Patient Instructions (Signed)
Thank you for coming in today  For your right hip, - Relative rest for 3-4 days - Start exercises early next week: Standing hip rotation, hip abduction, and hip extension at 45 degree angle. 3 x 15 of each on both sides  For your shin, - Continue dorsiflexion exercises - Try compression sleeve during activity and for 30 min after

## 2013-05-24 NOTE — Progress Notes (Signed)
Reason for visit: Followup flexion pain, new complaint of right hip pain  Subjective: Patient is a pleasant 47 year old very active female who presents for evaluation of right hip pain and followup of left shin pain.  She was previously seen here for her left shin pain and diagnosed with a anterior tibialis strain.  She stopped doing spin classes that seemed to be primarily exacerbating her pain.  She has also been doing the dorsiflexion exercises and wearing the lateral wedge and her shoe.  She feels like she is somewhat better but still has some discomfort and is concerned.  She thinks she has only improved because she has stopped doing spin class.  She is also complaining of right superolateral hip pain that has been present for the last 3-4 weeks.  It is near her ASIS.  She feels like it is right on bone.  She has been doing IT band stretching because she went online and this could be helpful.  She has been taking Motrin and diclofenac.  She notes the pain is aching during and after the run.  She is currently running about 4 miles twice a week.  She also does some swimming and the StairMaster.  Overall she thinks this is improving some.  Review of systems as related to current complaint reviewed and unchanged.  Objective: The patient is well-developed well-nourished female and in no acute distress.  She is awake alert and oriented x3.  Extraocular motion is intact.  No use of accessory muscles for breathing.  No skin rash. Left lower leg: Patient is tender to palpation over the body of the left tibialis anterior muscle.  She is nontender along the tibia bone or at the margin of the bone.  She has full strength in dorsiflexion and plantar flexion. Right hip: Patient has full range of motion without pain.  She is tender to palpation at the attachment of the TFL at the ASIS.  Strength is 5 out of 5 in hip flexion, abduction, extension.  She does have some discomfort with strength testing of the  TFL.  Musculoskeletal ultrasound: Limited ultrasound of the right superolateral hip at the ASIS was performed in transverse and longitudinal views.  Just off the ASIS at the area of the ATFL there is a hypoechoic fluid collection suggestive of bursitisIn both transverse and longitudinal views.  There is no fiber disruption.  Assessment: #1.  Right TFL strain/bursitis #2.  Left tibialis anterior strain  Plan: #1.  Discussed options for continued conservative management versus corticosteroid injection with the patient.  She would like to pursue corticosteroid injection into the hypoechoic bursitis.Risks and benefits were explained to the patient.  She was consented for procedure.  Skin was prepped with alcohol swab.  Ultrasound probe covered with sterile Tegaderm and sterile gel utilized.  The hypoechoic fluid collection was visualized on ultrasound.  This area was then injected with 4 cc of 1% lidocaine and 1 cc of Methylprednisolone using a 22-gauge 1-1/2 inch needle.Injection was visualized infiltrating the hypoechoic bursa on ultrasound.  Patient tolerated procedure well.  No complications.  Patient was given home exercise plan including IT band stretches, standing hip rotation, hip abduction and extension to begin 3 days after the injection. #2.  Patient encouraged to continue foot dorsiflexion exercises.  She was fitted with a body helix compression sleeve over her lower leg for her left tibialis anterior strain.

## 2013-06-14 ENCOUNTER — Ambulatory Visit
Admission: RE | Admit: 2013-06-14 | Discharge: 2013-06-14 | Disposition: A | Discharge status: Home / Self Care | Payer: Federal, State, Local not specified - PPO | Source: Ambulatory Visit

## 2013-06-14 DIAGNOSIS — Z1231 Encounter for screening mammogram for malignant neoplasm of breast: Secondary | ICD-10-CM

## 2013-06-18 ENCOUNTER — Ambulatory Visit (INDEPENDENT_AMBULATORY_CARE_PROVIDER_SITE_OTHER): Payer: Federal, State, Local not specified - PPO | Admitting: Family Medicine

## 2013-06-18 ENCOUNTER — Encounter: Payer: Self-pay | Admitting: Family Medicine

## 2013-06-18 VITALS — BP 121/77 | Ht 65.0 in | Wt 135.0 lb

## 2013-06-18 DIAGNOSIS — M25551 Pain in right hip: Secondary | ICD-10-CM

## 2013-06-18 DIAGNOSIS — M25559 Pain in unspecified hip: Secondary | ICD-10-CM

## 2013-06-18 MED ORDER — NITROGLYCERIN 0.2 MG/HR TD PT24: MEDICATED_PATCH | TRANSDERMAL | Status: DC

## 2013-06-18 NOTE — Assessment & Plan Note (Signed)
Recent increase in frequency of running. She has long-standing problems on in the past with her left SI joint so I think she's compensated for that without being aware of it. When she increased her frequency of running, the right hip is not tolerating that. On ultrasound today she still has some fluid collection on the left less than seen on last week's ultrasound. We will do abduction exercises only with Arava and. 50 per day. We'll not run for 4 days. Didn't try to run on flat surfaces for the next few weeks. Start nitroglycerin patch and oral anti-inflammatory for the next 3-4 weeks. If at that seems to be working or probably continue that for 4-6 weeks. To return when necessary. Also placed a 3/16 inch heel lift in the right shoe.

## 2013-06-18 NOTE — Progress Notes (Signed)
   Subjective:    Patient ID: Danielle [name], female    DOB: October 28, 1965, 47 y.o.   MRN: [id number]  HPI  Followup right hip pain. Injection helped a lot and she felt like the issue was resolving until she ran 2 days ago. With her her right she had to stop secondary to pain. Since then she said significant amount of pain, still in the same spot. She's a little frustrated. Pain is anterior lateral, worse with activity such as running although some walking will aggravate as well. No pain with just standing. Change in activity in the last few months his minute she's increased running from once a week to about twice a week and she's running 3-4 miles a day. On trails.  PERTINENT  PMH / PSH: Has had significant issues chronically with her left SI joint and with her left knee before. Never had a problem with her right ear.  Review of Systems No unusual weight change, no fever, sweats, chills. No radicular pain in her legs.    Objective:   Physical Exam  Vital signs are reviewed GENERAL: Well-developed female no acute distress HIP: Right. Internal/external rotation is painless and full. Mild gluteus medius weakness on that side compared to the other. Leg length discrepancy 1 cm short her on the right. GAIT: Shortened stride length on the right. From behind she also has a little bit of a dip in the right hemipelvis on landing.   Ultrasound: Air is no effusion in the actual hip joint. There is a small amount of fluid in a pseudo-bursa at the origin of the tensor fascia lata. Compared with the image from last week, this is smaller. There is a faint amount of increased Doppler activity in this area.     Assessment & Plan:

## 2014-05-23 ENCOUNTER — Other Ambulatory Visit: Payer: Self-pay

## 2014-05-23 DIAGNOSIS — Z1231 Encounter for screening mammogram for malignant neoplasm of breast: Secondary | ICD-10-CM

## 2014-06-16 ENCOUNTER — Ambulatory Visit
Admission: RE | Admit: 2014-06-16 | Discharge: 2014-06-16 | Disposition: A | Discharge status: Home / Self Care | Payer: Federal, State, Local not specified - PPO | Source: Ambulatory Visit

## 2014-06-16 DIAGNOSIS — Z1231 Encounter for screening mammogram for malignant neoplasm of breast: Secondary | ICD-10-CM

## 2015-03-10 ENCOUNTER — Encounter: Payer: Self-pay | Admitting: Family Medicine

## 2015-03-10 ENCOUNTER — Ambulatory Visit (INDEPENDENT_AMBULATORY_CARE_PROVIDER_SITE_OTHER): Payer: Federal, State, Local not specified - PPO | Admitting: Family Medicine

## 2015-03-10 VITALS — BP 120/71 | HR 62 | Ht 65.0 in | Wt 145.0 lb

## 2015-03-10 DIAGNOSIS — M722 Plantar fascial fibromatosis: Secondary | ICD-10-CM | POA: Insufficient documentation

## 2015-03-10 DIAGNOSIS — S93699A Other sprain of unspecified foot, initial encounter: Secondary | ICD-10-CM

## 2015-03-10 NOTE — Progress Notes (Signed)
  Danielle [name] - 49 y.o. female MRN [id number]  Date of birth: [birth year] Danielle [name] is a 49 y.o. female who presents today for right foot pain.  Right foot pain, initial visit-patient has been having plantar aspect calcaneal foot pain since the end of August 2016. She has a previous history of plantar fasciitis of the left foot treated with stretches, icing, nitroglycerin. She does endorse an increase in the use of her StairMaster at home and starting to try to run again in with new shoes. She does have a pes cavus foot and has never been fitted with custom orthotics before. She denies any paresthesias going into the distal aspect of her foot. She denies any frank weakness of either ankle or foot. She doesn't endorse tight calf muscles. She has not tried anything to date for her right heel pain. Pain is worse after prolonged activity.  PMHx - Updated and reviewed.  Contributory factors include: Plantar fasciitis left foot PSHx - Updated and reviewed.  Contributory factors include:  Noncontributory FHx - Updated and reviewed.  Contributory factors include:  Noncontributory Medications - none   ROS Per HPI.  Otherwise -12 point negative   Exam:  Filed Vitals:   03/10/15 1018  BP: 120/71  Pulse: 62   Gen: NAD Cardiorespiratory - Normal respiratory effort/rate.  RRR Right Ankle: No visible erythema or swelling. Tenderness palpation medial process calcaneal tubercle right. Pes cavus with transverse arch collapse with minimal splaying of the digits. Range of motion is full in all directions. Strength is 5/5 in all directions. Talar dome nontender; No pain at base of 5th MT; No tenderness over cuboid; No tenderness over N spot or navicular prominence No tenderness on posterior aspects of lateral and medial malleolus Able to walk 4 steps.

## 2015-03-10 NOTE — Progress Notes (Signed)
Patient ID: Danielle [name], female   DOB: [birth year], 49 y.o.   MRN: [id number] [facility]: Attending Note: I have reviewed the chart, discussed wit the Sports Medicine Fellow. I agree with assessment and treatment plan as detailed in the Fellow's note. We discussed restarting nitroglycerin patch which she had used for her other foot when she had issues with plantar fascia there. At this time she is only having mild to moderate symptoms so we'll see how she does with activity change, specifically cessation of stair stepper. I think this is stretching her plantar fascial unnecessarily. We also placed her in some support insoles. If she's not having improvement in 3 or 4 weeks, she'll let me know and we can restart the nitroglycerin patch. I'll see her back in 4-6 weeks.

## 2015-03-10 NOTE — Assessment & Plan Note (Signed)
Pain at the medial process of the calcaneal tuberosity of the right foot. -Start with great toe extension exercises/stretches multiple times per day. -Fitted with green pad inserts today as well. She does have an extremely pes cavus foot with slight collapse of the transverse arch in which she may eventually need custom-made orthotics. -Ice water submersion 2-3 times per day. -Follow-up in 6 weeks, consider cortico-steroid injection/nitroglycerin

## 2015-03-13 ENCOUNTER — Ambulatory Visit: Payer: Federal, State, Local not specified - PPO | Admitting: Family Medicine

## 2015-04-11 ENCOUNTER — Encounter: Payer: Self-pay | Admitting: Family Medicine

## 2015-04-11 ENCOUNTER — Ambulatory Visit
Admission: RE | Admit: 2015-04-11 | Discharge: 2015-04-11 | Disposition: A | Discharge status: Home / Self Care | Payer: Federal, State, Local not specified - PPO | Source: Ambulatory Visit | Attending: Family Medicine | Admitting: Family Medicine

## 2015-04-11 ENCOUNTER — Other Ambulatory Visit: Payer: Self-pay | Admitting: *Deleted

## 2015-04-11 ENCOUNTER — Ambulatory Visit (INDEPENDENT_AMBULATORY_CARE_PROVIDER_SITE_OTHER): Payer: Federal, State, Local not specified - PPO | Admitting: Family Medicine

## 2015-04-11 VITALS — BP 114/70 | Ht 65.0 in | Wt 140.0 lb

## 2015-04-11 DIAGNOSIS — M25571 Pain in right ankle and joints of right foot: Secondary | ICD-10-CM | POA: Diagnosis not present

## 2015-04-11 DIAGNOSIS — S93401A Sprain of unspecified ligament of right ankle, initial encounter: Secondary | ICD-10-CM

## 2015-04-11 DIAGNOSIS — M722 Plantar fascial fibromatosis: Secondary | ICD-10-CM | POA: Diagnosis not present

## 2015-04-11 MED ORDER — NITROGLYCERIN 0.2 MG/HR TD PT24: MEDICATED_PATCH | TRANSDERMAL | Status: DC

## 2015-04-12 DIAGNOSIS — S93401A Sprain of unspecified ligament of right ankle, initial encounter: Secondary | ICD-10-CM | POA: Insufficient documentation

## 2015-04-12 NOTE — Assessment & Plan Note (Signed)
Lateral. Inversion injury 2 weeks ago.  No history of recurrent ankle sprains.   Mostly tender over atfl.  Was improving until she went hiking 10 days ago. Also tender minimally over base of 5th (although no pain with resisted eversion) and posterior lateral malleolus.  Able to walk easily at time of injury as well as today.  - XR - Ankle rehab exercises with band provided.  - Discussed careful, gradual RTP. Avoid hilly and trail running until mostly back to normal.  - f/u PRN

## 2015-04-12 NOTE — Assessment & Plan Note (Signed)
Pain at the medial process of the calcaneal tuberosity of the right foot. No improvement with toe extension exercises, green inserts with arch supports, and occasional ice water submersion.  - nitro protocol. Previously helped left sided plantar fasciitis in the past. No issues previously. No migraines.  - Given handout and recommended more stretching including calf and hamstring stretching.  - f/u 2-3 weeks for orthotic construction.

## 2015-04-12 NOTE — Progress Notes (Signed)
  Danielle BernardBarbara C [name] - 49 y.o. female MRN [id number]  Date of birth: 20-Jan-1966  CC: Right ankle pain and PF f/u  SUBJECTIVE:   HPI  Active 49 yo female here for:   Right lateral ankle pain (new): - 2 weeks of right lateral ankle pain following inversion injury - Rolled foot on a root. - Persistent swelling since that time.  - Denies having frequent ankle sprains in the past.   - Was improving until she went on a previously planned hike ~10 days ago.  - Minimal bruising.  - able to walk 4 steps without any difficulty at the time of injury and since.  - no exercises.   Plantar Fasciitis: last seen on 03/10/2015 - Has begun doing toe extention exercises - Initially using ice bath, but transitioned to frozen water bottle.  - Has also worn insoles with scaphoid pads.  - In review --history of persistent PF in 2013 --Responed to NTG at that time.  --no trauma to heal -- No paresthesias  ROS:     14 point RoS negative other than that listed in HPI.   HISTORY: Past Medical, Surgical, Social, and Family History Reviewed & Updated per EMR.    OBJECTIVE: BP 114/70 mmHg  Ht 5\' 5"  (1.651 m)  Wt 140 lb (63.504 kg)  BMI 23.30 kg/m2  Physical Exam  Gen: NAD Cardiorespiratory - Normal respiratory effort/rate. RRR Right Ankle & foot: Minimal swelling anterior the lateral maleolus Tenderness palpation medial process calcaneal tubercle as well as anterior to the lateral malleolus over the ATFL.  Talar dome nontender; minimal pain at base of 5th MT; No tenderness over cuboid; No tenderness over N spot or navicular prominence Mild tenderness over the posterior aspect of the lateral aspect of the lateral malleolus. No medial malleolar tenderness. Pes cavus with transverse arch collapse with minimal splaying of the digits. Range of motion is full in all directions. Strength is 5/5 in all directions, including eversion. . Able to walk 4 steps without any difficulty.   MEDICATIONS, LABS &  OTHER ORDERS: Previous Medications   CALCIUM CARBONATE (TUMS - DOSED IN MG ELEMENTAL CALCIUM) 500 MG CHEWABLE TABLET    Chew 1 tablet by mouth daily.   FLUVIRIN PRESERVATIVE FREE 0.5 ML SUSY    ADM 0.5ML IM UTD   MULTIPLE VITAMIN (MULTIVITAMIN) TABLET    Take 1 tablet by mouth daily.     Modified Medications   No medications on file   New Prescriptions   NITROGLYCERIN (NITRODUR - DOSED IN MG/24 HR) 0.2 MG/HR PATCH    Use 1/4 patch to the foot every 24 hours   Discontinued Medications   No medications on file   Orders Placed This Encounter  Procedures  . DG Ankle Complete Right   ASSESSMENT & PLAN: See problem based charting & AVS for pt instructions.

## 2015-04-25 ENCOUNTER — Ambulatory Visit: Payer: Federal, State, Local not specified - PPO | Admitting: Family Medicine

## 2015-05-02 ENCOUNTER — Encounter: Payer: Self-pay | Admitting: Family Medicine

## 2015-05-02 ENCOUNTER — Ambulatory Visit (INDEPENDENT_AMBULATORY_CARE_PROVIDER_SITE_OTHER): Payer: Federal, State, Local not specified - PPO | Admitting: Family Medicine

## 2015-05-02 VITALS — BP 116/93 | Ht 65.0 in | Wt 140.0 lb

## 2015-05-02 DIAGNOSIS — M722 Plantar fascial fibromatosis: Secondary | ICD-10-CM | POA: Diagnosis not present

## 2015-05-02 DIAGNOSIS — S93401A Sprain of unspecified ligament of right ankle, initial encounter: Secondary | ICD-10-CM

## 2015-05-03 NOTE — Assessment & Plan Note (Signed)
Persistent problem in the past. High arches.  - Orthotics constructed today.  - d/c nitro protocol as she has not been doing it and it is unlikely to help this problem.  - Continue stretching and strengthening.   -f/u 4 weeks.

## 2015-05-03 NOTE — Progress Notes (Signed)
  Danielle [name] - 49 y.o. female MRN [id number]  Date of birth: 22-Sep-1965  CC: Right ankle pain.   SUBJECTIVE:   HPI  Active 49 yo female here for:   Right lateral ankle pain: Last discussed on 04/11/2015 - 5 weeks of right lateral ankle pain following inversion injury - Rolled foot on a root. - Persistent swelling since that has persisted. Minimal pain when walking.  - Denies having frequent ankle sprains in the past.  - Was improving until she went on a previously planned hike ~10 days ago.  - Walking okay - Not doing ankle rehab exercises.   Plantar Fasciitis: last seen on 04/11/2015 - Slightly improved - Not doing toe extension exercises frequently.  - Initially using ice bath, but transitioned to frozen water bottle.  - history of persistent PF in 2013 - Not using NTG - No paresthesias  ROS:     14 point RoS negative other than that listed in HPI.   HISTORY: Past Medical, Surgical, Social, and Family History Reviewed & Updated per EMR.    OBJECTIVE: BP 116/93 mmHg  Ht 5\' 5"  (1.651 m)  Wt 140 lb (63.504 kg)  BMI 23.30 kg/m2  Physical Exam  Gen: NAD Cardiorespiratory - Normal respiratory effort/rate. RRR Right Ankle & foot: Minimal swelling anterior the lateral maleolus Tenderness palpation  over the ATFL.  Talar dome nontender; minimal pain at base of 5th MT; No tenderness over cuboid; No tenderness over N spot or navicular prominence Mild tenderness over the posterior aspect of the lateral aspect of the lateral malleolus. No medial malleolar tenderness.  Pes cavus with transverse arch collapse with minimal splaying of the digits. Range of motion is full in all directions. Strength is 5/5 in all directions, including eversion. . Able to walk 4 steps without any difficulty.  MEDICATIONS, LABS & OTHER ORDERS: Previous Medications   CALCIUM CARBONATE (TUMS - DOSED IN MG ELEMENTAL CALCIUM) 500 MG CHEWABLE TABLET    Chew 1 tablet by mouth daily.   FLUVIRIN PRESERVATIVE FREE 0.5 ML SUSY    ADM 0.5ML IM UTD   MULTIPLE VITAMIN (MULTIVITAMIN) TABLET    Take 1 tablet by mouth daily.     NITROGLYCERIN (NITRODUR - DOSED IN MG/24 HR) 0.2 MG/HR PATCH    Use 1/4 patch to the foot every 24 hours   Modified Medications   No medications on file   New Prescriptions   No medications on file   Discontinued Medications   No medications on file  No orders of the defined types were placed in this encounter.   ASSESSMENT & PLAN: Patient was fitted for a : standard, cushioned, semi-rigid orthotic. The orthotic was heated and afterward the patient stood on the orthotic blank positioned on the orthotic stand. The patient was positioned in subtalar neutral position and 10 degrees of ankle dorsiflexion in a weight bearing stance. After completion of molding, a stable base was applied to the orthotic blank. The blank was ground to a stable position for weight bearing. Base: Blue EVA Posting: none Additional orthotic padding: none  40 minutes were spent on construction, adjustment, and counseling on orthotics, more than half of which was direct patient contact.  See problem based charting & AVS for pt instructions.

## 2015-05-03 NOTE — Assessment & Plan Note (Signed)
Lateral inversion injury 5 weeks ago. No hx of ankle sprains. Has peristent swelling, but minimal pain with weight bearing. XR negative - Discussed elimination of stair master for the time being.   - Encouraged to do band exercises as much as possible.   - provided with ankle compression sleeve.  - f/u PRN.

## 2015-05-12 ENCOUNTER — Other Ambulatory Visit: Payer: Self-pay

## 2015-05-12 DIAGNOSIS — Z1231 Encounter for screening mammogram for malignant neoplasm of breast: Secondary | ICD-10-CM

## 2015-06-20 ENCOUNTER — Ambulatory Visit
Admission: RE | Admit: 2015-06-20 | Discharge: 2015-06-20 | Disposition: A | Discharge status: Home / Self Care | Payer: Federal, State, Local not specified - PPO | Source: Ambulatory Visit

## 2015-06-20 DIAGNOSIS — Z1231 Encounter for screening mammogram for malignant neoplasm of breast: Secondary | ICD-10-CM

## 2015-07-05 ENCOUNTER — Encounter (HOSPITAL_COMMUNITY): Payer: Self-pay | Admitting: Family Medicine

## 2015-07-05 ENCOUNTER — Emergency Department (HOSPITAL_COMMUNITY)
Admission: EM | Admit: 2015-07-05 | Discharge: 2015-07-06 | Disposition: A | Discharge status: Home / Self Care | Payer: Federal, State, Local not specified - PPO | Attending: Emergency Medicine | Admitting: Emergency Medicine

## 2015-07-05 DIAGNOSIS — S2232XA Fracture of one rib, left side, initial encounter for closed fracture: Secondary | ICD-10-CM

## 2015-07-05 DIAGNOSIS — Y9389 Activity, other specified: Secondary | ICD-10-CM | POA: Diagnosis not present

## 2015-07-05 DIAGNOSIS — S139XXA Sprain of joints and ligaments of unspecified parts of neck, initial encounter: Secondary | ICD-10-CM

## 2015-07-05 DIAGNOSIS — Y999 Unspecified external cause status: Secondary | ICD-10-CM | POA: Diagnosis not present

## 2015-07-05 DIAGNOSIS — F419 Anxiety disorder, unspecified: Secondary | ICD-10-CM | POA: Diagnosis not present

## 2015-07-05 DIAGNOSIS — S63502A Unspecified sprain of left wrist, initial encounter: Secondary | ICD-10-CM | POA: Diagnosis not present

## 2015-07-05 DIAGNOSIS — Z23 Encounter for immunization: Secondary | ICD-10-CM | POA: Diagnosis not present

## 2015-07-05 DIAGNOSIS — S299XXA Unspecified injury of thorax, initial encounter: Secondary | ICD-10-CM | POA: Diagnosis present

## 2015-07-05 DIAGNOSIS — S60812A Abrasion of left wrist, initial encounter: Secondary | ICD-10-CM | POA: Diagnosis not present

## 2015-07-05 DIAGNOSIS — Z79899 Other long term (current) drug therapy: Secondary | ICD-10-CM | POA: Diagnosis not present

## 2015-07-05 DIAGNOSIS — S134XXA Sprain of ligaments of cervical spine, initial encounter: Secondary | ICD-10-CM | POA: Insufficient documentation

## 2015-07-05 DIAGNOSIS — Y92410 Unspecified street and highway as the place of occurrence of the external cause: Secondary | ICD-10-CM | POA: Diagnosis not present

## 2015-07-05 MED ORDER — OXYCODONE-ACETAMINOPHEN 5-325 MG PO TABS
ORAL_TABLET | ORAL | Status: AC
  Filled 2015-07-05: qty 1

## 2015-07-05 MED ORDER — OXYCODONE-ACETAMINOPHEN 5-325 MG PO TABS
1.0000 | ORAL_TABLET | Freq: Once | ORAL | Status: AC
  Administered 2015-07-05: 1 via ORAL

## 2015-07-05 NOTE — ED Notes (Signed)
Husband reports pt's pain is not any better.  Requesting more pain medication.  Dr. Ranae PalmsYelverton notified of 2nd Percocet administered.
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
## 2015-07-06 ENCOUNTER — Emergency Department (HOSPITAL_COMMUNITY): Payer: Federal, State, Local not specified - PPO

## 2015-07-06 DIAGNOSIS — S2232XA Fracture of one rib, left side, initial encounter for closed fracture: Secondary | ICD-10-CM | POA: Diagnosis not present

## 2015-07-06 MED ORDER — CYCLOBENZAPRINE HCL 10 MG PO TABS
10.0000 mg | ORAL_TABLET | Freq: Every day | ORAL | Status: DC
Start: 2015-07-06 — End: 2017-07-11

## 2015-07-06 MED ORDER — DIAZEPAM 5 MG PO TABS
5.0000 mg | ORAL_TABLET | Freq: Once | ORAL | Status: AC
  Administered 2015-07-06: 5 mg via ORAL
  Filled 2015-07-06: qty 1

## 2015-07-06 MED ORDER — TETANUS-DIPHTH-ACELL PERTUSSIS 5-2.5-18.5 LF-MCG/0.5 IM SUSP
0.5000 mL | Freq: Once | INTRAMUSCULAR | Status: AC
  Administered 2015-07-06: 0.5 mL via INTRAMUSCULAR
  Filled 2015-07-06: qty 0.5

## 2015-07-06 MED ORDER — MORPHINE SULFATE (PF) 4 MG/ML IV SOLN
4.0000 mg | Freq: Once | INTRAVENOUS | Status: AC
  Administered 2015-07-06: 4 mg via INTRAVENOUS
  Filled 2015-07-06: qty 1

## 2015-07-06 MED ORDER — OXYCODONE-ACETAMINOPHEN 5-325 MG PO TABS: 1.0000 | ORAL_TABLET | ORAL | Status: DC | PRN

## 2015-07-06 MED ORDER — OXYCODONE-ACETAMINOPHEN 5-325 MG PO TABS
1.0000 | ORAL_TABLET | Freq: Once | ORAL | Status: AC
  Administered 2015-07-06: 1 via ORAL
  Filled 2015-07-06: qty 1

## 2015-07-06 MED ORDER — KETOROLAC TROMETHAMINE 30 MG/ML IJ SOLN
30.0000 mg | Freq: Once | INTRAMUSCULAR | Status: AC
  Administered 2015-07-06: 30 mg via INTRAVENOUS
  Filled 2015-07-06: qty 1

## 2015-07-06 MED ORDER — IBUPROFEN 600 MG PO TABS: 600.0000 mg | ORAL_TABLET | Freq: Four times a day (QID) | ORAL | Status: DC | PRN
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
## 2015-07-06 NOTE — ED Notes (Signed)
Patient transported to X-ray 

## 2015-07-06 NOTE — ED Notes (Signed)
Ambulated pt to restroom and back to room.

## 2015-07-06 NOTE — ED Notes (Signed)
Dr. Wickline at bedside at this time.  

## 2015-07-06 NOTE — ED Notes (Signed)
RT at bedside at this time.
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
## 2016-04-04 DIAGNOSIS — K080 Exfoliation of teeth due to systemic causes: Secondary | ICD-10-CM | POA: Diagnosis not present

## 2016-05-17 ENCOUNTER — Other Ambulatory Visit: Payer: Self-pay | Admitting: Internal Medicine

## 2016-05-17 DIAGNOSIS — Z1231 Encounter for screening mammogram for malignant neoplasm of breast: Secondary | ICD-10-CM

## 2016-05-22 DIAGNOSIS — G8929 Other chronic pain: Secondary | ICD-10-CM | POA: Diagnosis not present

## 2016-05-22 DIAGNOSIS — M25562 Pain in left knee: Secondary | ICD-10-CM | POA: Diagnosis not present

## 2016-06-04 DIAGNOSIS — M25562 Pain in left knee: Secondary | ICD-10-CM | POA: Diagnosis not present

## 2016-06-04 DIAGNOSIS — G8929 Other chronic pain: Secondary | ICD-10-CM | POA: Diagnosis not present

## 2016-06-07 DIAGNOSIS — G8929 Other chronic pain: Secondary | ICD-10-CM | POA: Diagnosis not present

## 2016-06-07 DIAGNOSIS — M25562 Pain in left knee: Secondary | ICD-10-CM | POA: Diagnosis not present

## 2016-06-11 DIAGNOSIS — M25562 Pain in left knee: Secondary | ICD-10-CM | POA: Diagnosis not present

## 2016-06-11 DIAGNOSIS — G8929 Other chronic pain: Secondary | ICD-10-CM | POA: Diagnosis not present

## 2016-06-14 DIAGNOSIS — M25562 Pain in left knee: Secondary | ICD-10-CM | POA: Diagnosis not present

## 2016-06-14 DIAGNOSIS — G8929 Other chronic pain: Secondary | ICD-10-CM | POA: Diagnosis not present

## 2016-06-18 DIAGNOSIS — G8929 Other chronic pain: Secondary | ICD-10-CM | POA: Diagnosis not present

## 2016-06-18 DIAGNOSIS — M25562 Pain in left knee: Secondary | ICD-10-CM | POA: Diagnosis not present

## 2016-07-02 DIAGNOSIS — G8929 Other chronic pain: Secondary | ICD-10-CM | POA: Diagnosis not present

## 2016-07-02 DIAGNOSIS — M25562 Pain in left knee: Secondary | ICD-10-CM | POA: Diagnosis not present

## 2016-07-05 DIAGNOSIS — G8929 Other chronic pain: Secondary | ICD-10-CM | POA: Diagnosis not present

## 2016-07-05 DIAGNOSIS — M25562 Pain in left knee: Secondary | ICD-10-CM | POA: Diagnosis not present

## 2016-07-08 ENCOUNTER — Ambulatory Visit: Payer: Federal, State, Local not specified - PPO

## 2016-07-08 ENCOUNTER — Ambulatory Visit
Admission: RE | Admit: 2016-07-08 | Discharge: 2016-07-08 | Disposition: A | Discharge status: Home / Self Care | Payer: Federal, State, Local not specified - PPO | Source: Ambulatory Visit | Attending: Internal Medicine | Admitting: Internal Medicine

## 2016-07-08 DIAGNOSIS — Z1231 Encounter for screening mammogram for malignant neoplasm of breast: Secondary | ICD-10-CM | POA: Diagnosis not present

## 2016-07-10 DIAGNOSIS — G8929 Other chronic pain: Secondary | ICD-10-CM | POA: Diagnosis not present

## 2016-07-10 DIAGNOSIS — M25562 Pain in left knee: Secondary | ICD-10-CM | POA: Diagnosis not present

## 2016-07-12 DIAGNOSIS — G8929 Other chronic pain: Secondary | ICD-10-CM | POA: Diagnosis not present

## 2016-07-12 DIAGNOSIS — M25562 Pain in left knee: Secondary | ICD-10-CM | POA: Diagnosis not present

## 2016-07-19 DIAGNOSIS — G8929 Other chronic pain: Secondary | ICD-10-CM | POA: Diagnosis not present

## 2016-07-19 DIAGNOSIS — M25562 Pain in left knee: Secondary | ICD-10-CM | POA: Diagnosis not present

## 2016-08-02 DIAGNOSIS — G8929 Other chronic pain: Secondary | ICD-10-CM | POA: Diagnosis not present

## 2016-08-02 DIAGNOSIS — M25562 Pain in left knee: Secondary | ICD-10-CM | POA: Diagnosis not present

## 2016-09-02 DIAGNOSIS — M25562 Pain in left knee: Secondary | ICD-10-CM | POA: Diagnosis not present

## 2016-09-02 DIAGNOSIS — G8929 Other chronic pain: Secondary | ICD-10-CM | POA: Diagnosis not present

## 2016-10-04 DIAGNOSIS — K080 Exfoliation of teeth due to systemic causes: Secondary | ICD-10-CM | POA: Diagnosis not present

## 2017-01-08 DIAGNOSIS — M25562 Pain in left knee: Secondary | ICD-10-CM | POA: Diagnosis not present

## 2017-01-08 DIAGNOSIS — G8929 Other chronic pain: Secondary | ICD-10-CM | POA: Diagnosis not present

## 2017-01-14 DIAGNOSIS — G8929 Other chronic pain: Secondary | ICD-10-CM | POA: Diagnosis not present

## 2017-01-14 DIAGNOSIS — M25562 Pain in left knee: Secondary | ICD-10-CM | POA: Diagnosis not present

## 2017-01-28 DIAGNOSIS — M25562 Pain in left knee: Secondary | ICD-10-CM | POA: Diagnosis not present

## 2017-01-28 DIAGNOSIS — M2242 Chondromalacia patellae, left knee: Secondary | ICD-10-CM | POA: Diagnosis not present

## 2017-01-28 DIAGNOSIS — G8929 Other chronic pain: Secondary | ICD-10-CM | POA: Diagnosis not present

## 2017-03-18 DIAGNOSIS — M2242 Chondromalacia patellae, left knee: Secondary | ICD-10-CM | POA: Diagnosis not present

## 2017-03-18 DIAGNOSIS — M25562 Pain in left knee: Secondary | ICD-10-CM | POA: Diagnosis not present

## 2017-03-18 DIAGNOSIS — G8929 Other chronic pain: Secondary | ICD-10-CM | POA: Diagnosis not present

## 2017-03-25 DIAGNOSIS — G8929 Other chronic pain: Secondary | ICD-10-CM | POA: Diagnosis not present

## 2017-03-25 DIAGNOSIS — M25562 Pain in left knee: Secondary | ICD-10-CM | POA: Diagnosis not present

## 2017-04-01 DIAGNOSIS — M25562 Pain in left knee: Secondary | ICD-10-CM | POA: Diagnosis not present

## 2017-04-01 DIAGNOSIS — M2242 Chondromalacia patellae, left knee: Secondary | ICD-10-CM | POA: Diagnosis not present

## 2017-04-01 DIAGNOSIS — G8929 Other chronic pain: Secondary | ICD-10-CM | POA: Diagnosis not present

## 2017-04-11 DIAGNOSIS — K080 Exfoliation of teeth due to systemic causes: Secondary | ICD-10-CM | POA: Diagnosis not present

## 2017-04-28 DIAGNOSIS — Z0000 Encounter for general adult medical examination without abnormal findings: Secondary | ICD-10-CM | POA: Diagnosis not present

## 2017-04-28 DIAGNOSIS — R7309 Other abnormal glucose: Secondary | ICD-10-CM | POA: Diagnosis not present

## 2017-04-28 DIAGNOSIS — R87618 Other abnormal cytological findings on specimens from cervix uteri: Secondary | ICD-10-CM | POA: Diagnosis not present

## 2017-04-28 DIAGNOSIS — R946 Abnormal results of thyroid function studies: Secondary | ICD-10-CM | POA: Diagnosis not present

## 2017-04-28 DIAGNOSIS — E7800 Pure hypercholesterolemia, unspecified: Secondary | ICD-10-CM | POA: Diagnosis not present

## 2017-05-13 DIAGNOSIS — M2242 Chondromalacia patellae, left knee: Secondary | ICD-10-CM | POA: Diagnosis not present

## 2017-05-13 DIAGNOSIS — M25562 Pain in left knee: Secondary | ICD-10-CM | POA: Diagnosis not present

## 2017-05-13 DIAGNOSIS — G8929 Other chronic pain: Secondary | ICD-10-CM | POA: Diagnosis not present

## 2017-05-20 DIAGNOSIS — E7800 Pure hypercholesterolemia, unspecified: Secondary | ICD-10-CM | POA: Diagnosis not present

## 2017-05-28 ENCOUNTER — Other Ambulatory Visit: Payer: Self-pay | Admitting: Internal Medicine

## 2017-05-28 DIAGNOSIS — Z139 Encounter for screening, unspecified: Secondary | ICD-10-CM

## 2017-07-11 ENCOUNTER — Encounter: Payer: Self-pay | Admitting: Obstetrics & Gynecology

## 2017-07-11 ENCOUNTER — Ambulatory Visit: Payer: Federal, State, Local not specified - PPO | Admitting: Obstetrics & Gynecology

## 2017-07-11 VITALS — BP 128/76 | Ht 66.0 in | Wt 158.0 lb

## 2017-07-11 DIAGNOSIS — N951 Menopausal and female climacteric states: Secondary | ICD-10-CM

## 2017-07-11 DIAGNOSIS — Z13820 Encounter for screening for osteoporosis: Secondary | ICD-10-CM

## 2017-07-11 DIAGNOSIS — Z01419 Encounter for gynecological examination (general) (routine) without abnormal findings: Secondary | ICD-10-CM | POA: Diagnosis not present

## 2017-07-11 DIAGNOSIS — Z9189 Other specified personal risk factors, not elsewhere classified: Secondary | ICD-10-CM

## 2017-07-11 MED ORDER — PROGESTERONE MICRONIZED 100 MG PO CAPS: 100.0000 mg | ORAL_CAPSULE | Freq: Every day | ORAL | 4 refills | Status: DC

## 2017-07-11 MED ORDER — ESTRADIOL 0.05 MG/24HR TD PTTW: 1.0000 | MEDICATED_PATCH | TRANSDERMAL | 4 refills | Status: DC

## 2017-07-11 NOTE — Progress Notes (Addendum)
Danielle [name] 05-15-1966 [id number]   History:    52 y.o.  G3P2A1 Married.  Vasectomy.  RP:  Established patient presenting for annual gyn exam   HPI: Last menstrual period November 2016.  No vaginal bleeding since then.  Experiencing severe hot flashes and night sweats most days.  Would like to start on hormone replacement therapy.  No pelvic pain.  But complains of vulvar and vaginal dryness with intercourse and irritation afterwards.  Using [place] currently.  No abnormal vaginal discharge.  Her urine and bowel movements normal.  Breasts normal.  Scheduled for screening mammogram next Monday.  Health labs with family physician.  Past medical history,surgical history, family history and social history were all reviewed and documented in the EPIC chart.  Gynecologic History No LMP recorded. Patient is not currently having periods (Reason: Other). Contraception: vasectomy Last Pap: 11/2014. Results were: negative/HPV HR neg Last mammogram: 07/2016. Results were: normal Colonoscopy 01/2015, ten yr schedule Bone density Never  Obstetric History OB History  Gravida Para Term Preterm AB Living  3 2     1 2   SAB TAB Ectopic Multiple Live Births  1            # Outcome Date GA Lbr Len/2nd Weight Sex Delivery Anes PTL Lv  3 SAB           2 Para           1 Para                ROS: A ROS was performed and pertinent positives and negatives are included in the history.  GENERAL: No fevers or chills. HEENT: No change in vision, no earache, sore throat or sinus congestion. NECK: No pain or stiffness. CARDIOVASCULAR: No chest pain or pressure. No palpitations. PULMONARY: No shortness of breath, cough or wheeze. GASTROINTESTINAL: No abdominal pain, nausea, vomiting or diarrhea, melena or bright red blood per rectum. GENITOURINARY: No urinary frequency, urgency, hesitancy or dysuria. MUSCULOSKELETAL: No joint or muscle pain, no back pain, no recent trauma. DERMATOLOGIC: No rash, no itching, no  lesions. ENDOCRINE: No polyuria, polydipsia, no heat or cold intolerance. No recent change in weight. HEMATOLOGICAL: No anemia or easy bruising or bleeding. NEUROLOGIC: No headache, seizures, numbness, tingling or weakness. PSYCHIATRIC: No depression, no loss of interest in normal activity or change in sleep pattern.     Exam:   BP 128/76   Ht 5\' 6"  (1.676 m)   Wt 158 lb (71.7 kg)   BMI 25.50 kg/m   Body mass index is 25.5 kg/m.  General appearance : Well developed well nourished female. No acute distress HEENT: Eyes: no retinal hemorrhage or exudates,  Neck supple, trachea midline, no carotid bruits, no thyroidmegaly Lungs: Clear to auscultation, no rhonchi or wheezes, or rib retractions  Heart: Regular rate and rhythm, no murmurs or gallops Breast:Examined in sitting and supine position were symmetrical in appearance, no palpable masses or tenderness,  no skin retraction, no nipple inversion, no nipple discharge, no skin discoloration, no axillary or supraclavicular lymphadenopathy Abdomen: no palpable masses or tenderness, no rebound or guarding Extremities: no edema or skin discoloration or tenderness  Pelvic: Vulva normal  Bartholin, Urethra, Skene Glands: Within normal limits             Vagina: No gross lesions or discharge  Cervix: No gross lesions or discharge.  Pap reflex done.  Uterus  AV, normal size, shape and consistency, non-tender and mobile  Adnexa  Without  masses or tenderness  Anus and perineum  normal    Assessment/Plan:  52 y.o. female for annual exam   1. Encounter for routine gynecological examination with Papanicolaou smear of cervix Normal gynecologic exam.  Pap reflex done.  Breast exam normal.  Screening mammogram scheduled for next week.  Colonoscopy negative in August 2016.  Health labs with family physician.  2. Menopause syndrome Very symptomatic with hot flushes and night sweats.  Also has dryness and pain with intercourse.  No contraindication to  hormone replacement therapy.  Risks, benefits and usage thoroughly reviewed.  Risks of breast cancer after 10 years of use and risk of stroke increasing with age discussed.  Benefits by controlling vasomotor menopausal symptoms, decreasing the rate of bone loss, cardiovascular protection and overall benefits on skin and moods reviewed.  Decision to start with estradiol patch 0.05 and Prometrium 100 mg at bedtime.  Prescriptions sent to pharmacy.  3. Screening for osteoporosis Recommend vitamin D supplements, calcium rich nutrition and regular weightbearing physical activity.  Will follow up here for bone density. - DG Bone Density; Future  4. Relies on partner vasectomy for contraception  Other orders - estradiol (VIVELLE-DOT) 0.05 MG/24HR patch; Place 1 patch (0.05 mg total) onto the skin 2 (two) times a week. - progesterone (PROMETRIUM) 100 MG capsule; Take 1 capsule (100 mg total) by mouth at bedtime.  Genia DelMarie-Lyne Hiliana Eilts MD, 10:08 AM 07/11/2017

## 2017-07-11 NOTE — Patient Instructions (Signed)
1. Encounter for routine gynecological examination with Papanicolaou smear of cervix Normal gynecologic exam.  Pap reflex done.  Breast exam normal.  Screening mammogram scheduled for next week.  Colonoscopy negative in August 2016.  Health labs with family physician.  2. Menopause syndrome Very symptomatic with hot flushes and night sweats.  Also has dryness and pain with intercourse.  No contraindication to hormone replacement therapy.  Risks, benefits and usage thoroughly reviewed.  Risks of breast cancer after 10 years of use and risk of stroke increasing with age discussed.  Benefits by controlling vasomotor menopausal symptoms, decreasing the rate of bone loss, cardiovascular protection and overall benefits on skin and moods reviewed.  Decision to start with estradiol patch 0.05 and Prometrium 100 mg at bedtime.  Prescriptions sent to pharmacy.  3. Screening for osteoporosis Recommend vitamin D supplements, calcium rich nutrition and regular weightbearing physical activity.  Will follow up here for bone density. - DG Bone Density; Future  4. Relies on partner vasectomy for contraception  Other orders - estradiol (VIVELLE-DOT) 0.05 MG/24HR patch; Place 1 patch (0.05 mg total) onto the skin 2 (two) times a week. - progesterone (PROMETRIUM) 100 MG capsule; Take 1 capsule (100 mg total) by mouth at bedtime.  Arayna, it was a pleasure seeing you today!  I will inform you of your results as soon as they are available.   Menopause and Hormone Replacement Therapy What is hormone replacement therapy? Hormone replacement therapy (HRT) is the use of artificial (synthetic) hormones to replace hormones that your body stops producing during menopause. Menopause is the normal time of life when menstrual periods stop completely and the ovaries stop producing the female hormones estrogen and progesterone. This lack of hormones can affect your health and cause undesirable symptoms. HRT can relieve some of  those symptoms. What are my options for HRT? HRT may consist of the synthetic hormones estrogen and progestin, or it may consist of only estrogen (estrogen-only therapy). You and your health care provider will decide which form of HRT is best for you. If you choose to be on HRT and you have a uterus, estrogen and progestin are usually prescribed. Estrogen-only therapy is used for women who do not have a uterus. Possible options for taking HRT include:  Pills.  Patches.  Gels.  Sprays.  Vaginal cream.  Vaginal rings.  Vaginal inserts.  The amount of hormone(s) that you take and how long you take the hormone(s) varies depending on your individual health. It is important to:  Begin HRT with the lowest possible dosage.  Stop HRT as soon as your health care provider tells you to stop.  Work with your health care provider so that you feel informed and comfortable with your decisions.  What are the benefits of HRT? HRT can reduce the frequency and severity of menopausal symptoms. Benefits of HRT vary depending on the menopausal symptoms that you have, the severity of your symptoms, and your overall health. HRT may help to improve the following menopausal symptoms:  Hot flashes and night sweats. These are sudden feelings of heat that spread over the face and body. The skin may turn red, like a blush. Night sweats are hot flashes that happen while you are sleeping or trying to sleep.  Bone loss (osteoporosis). The body loses calcium more quickly after menopause, causing the bones to become weaker. This can increase the risk for bone breaks (fractures).  Vaginal dryness. The lining of the vagina can become thin and dry, which can  cause pain during sexual intercourse or cause infection, burning, or itching.  Urinary tract infections.  Urinary incontinence. This is a decreased ability to control when you urinate.  Irritability.  Short-term memory problems.  What are the risks of  HRT? Risks of HRT vary depending on your individual health and medical history. Risks of HRT also depend on whether you receive both estrogen and progestin or you receive estrogen only.HRT may increase the risk of:  Spotting. This is when a small amount of bloodleaks from the vagina unexpectedly.  Endometrial cancer. This cancer is in the lining of the uterus (endometrium).  Breast cancer.  Increased density of breast tissue. This can make it harder to find breast cancer on a breast X-ray (mammogram).  Stroke.  Heart attack.  Blood clots.  Gallbladder disease.  Risks of HRT can increase if you have any of the following conditions:  Endometrial cancer.  Liver disease.  Heart disease.  Breast cancer.  History of blood clots.  History of stroke.  How should I care for myself while I am on HRT?  Take over-the-counter and prescription medicines only as told by your health care provider.  Get mammograms, pelvic exams, and medical checkups as often as told by your health care provider.  Have Pap tests done as often as told by your health care provider. A Pap test is sometimes called a Pap smear. It is a screening test that is used to check for signs of cancer of the cervix and vagina. A Pap test can also identify the presence of infection or precancerous changes. Pap tests may be done: ? Every 3 years, starting at age 54. ? Every 5 years, starting after age 54, in combination with testing for human papillomavirus (HPV). ? More often or less often depending on other medical conditions you have, your age, and other risk factors.  It is your responsibility to get your Pap test results. Ask your health care provider or the department performing the test when your results will be ready.  Keep all follow-up visits as told by your health care provider. This is important. When should I seek medical care? Talk with your health care provider if:  You have any of these: ? Pain or  swelling in your legs. ? Shortness of breath. ? Chest pain. ? Lumps or changes in your breasts or armpits. ? Slurred speech. ? Pain, burning, or bleeding when you urine.  You develop any of these: ? Unusual vaginal bleeding. ? Dizziness or headaches. ? Weakness or numbness in any part of your arms or legs. ? Pain in your abdomen.  This information is not intended to replace advice given to you by your health care provider. Make sure you discuss any questions you have with your health care provider. Document Released: 03/16/2003 Document Revised: 05/14/2016 Document Reviewed: 12/19/2014 Elsevier Interactive Patient Education  2017 [employer]

## 2017-07-11 NOTE — Addendum Note (Signed)
Addended by: Berna SpareASTILLO, Delona Clasby A on: 07/11/2017 10:59 AM   Modules accepted: Orders

## 2017-07-14 ENCOUNTER — Ambulatory Visit
Admission: RE | Admit: 2017-07-14 | Discharge: 2017-07-14 | Disposition: A | Discharge status: Home / Self Care | Payer: Federal, State, Local not specified - PPO | Source: Ambulatory Visit | Attending: Internal Medicine | Admitting: Internal Medicine

## 2017-07-14 DIAGNOSIS — Z139 Encounter for screening, unspecified: Secondary | ICD-10-CM

## 2017-07-14 DIAGNOSIS — Z1231 Encounter for screening mammogram for malignant neoplasm of breast: Secondary | ICD-10-CM | POA: Diagnosis not present

## 2017-07-15 LAB — PAP IG W/ RFLX HPV ASCU

## 2017-07-21 ENCOUNTER — Encounter: Payer: Self-pay | Admitting: *Deleted

## 2017-07-23 ENCOUNTER — Other Ambulatory Visit: Payer: Self-pay | Admitting: Gynecology

## 2017-07-23 DIAGNOSIS — Z13820 Encounter for screening for osteoporosis: Secondary | ICD-10-CM

## 2017-08-01 DIAGNOSIS — M8580 Other specified disorders of bone density and structure, unspecified site: Secondary | ICD-10-CM

## 2017-08-01 HISTORY — DX: Other specified disorders of bone density and structure, unspecified site: M85.80

## 2017-08-05 ENCOUNTER — Other Ambulatory Visit: Payer: Self-pay | Admitting: Gynecology

## 2017-08-05 ENCOUNTER — Ambulatory Visit (INDEPENDENT_AMBULATORY_CARE_PROVIDER_SITE_OTHER): Payer: Federal, State, Local not specified - PPO

## 2017-08-05 ENCOUNTER — Encounter: Payer: Self-pay | Admitting: Gynecology

## 2017-08-05 DIAGNOSIS — Z13820 Encounter for screening for osteoporosis: Secondary | ICD-10-CM | POA: Diagnosis not present

## 2017-08-05 DIAGNOSIS — M8589 Other specified disorders of bone density and structure, multiple sites: Secondary | ICD-10-CM | POA: Diagnosis not present

## 2017-10-24 DIAGNOSIS — K080 Exfoliation of teeth due to systemic causes: Secondary | ICD-10-CM | POA: Diagnosis not present

## 2017-12-15 DIAGNOSIS — E559 Vitamin D deficiency, unspecified: Secondary | ICD-10-CM | POA: Diagnosis not present

## 2017-12-15 DIAGNOSIS — E7800 Pure hypercholesterolemia, unspecified: Secondary | ICD-10-CM | POA: Diagnosis not present

## 2017-12-15 DIAGNOSIS — R7303 Prediabetes: Secondary | ICD-10-CM | POA: Diagnosis not present

## 2018-05-08 DIAGNOSIS — K080 Exfoliation of teeth due to systemic causes: Secondary | ICD-10-CM | POA: Diagnosis not present

## 2018-06-08 ENCOUNTER — Other Ambulatory Visit: Payer: Self-pay | Admitting: Internal Medicine

## 2018-06-08 DIAGNOSIS — Z1231 Encounter for screening mammogram for malignant neoplasm of breast: Secondary | ICD-10-CM

## 2018-07-14 ENCOUNTER — Encounter: Payer: Federal, State, Local not specified - PPO | Admitting: Obstetrics & Gynecology

## 2018-07-15 ENCOUNTER — Encounter: Payer: Federal, State, Local not specified - PPO | Admitting: Obstetrics & Gynecology

## 2018-07-20 ENCOUNTER — Ambulatory Visit
Admission: RE | Admit: 2018-07-20 | Discharge: 2018-07-20 | Disposition: A | Discharge status: Home / Self Care | Payer: Federal, State, Local not specified - PPO | Source: Ambulatory Visit | Attending: Internal Medicine | Admitting: Internal Medicine

## 2018-07-20 DIAGNOSIS — Z1231 Encounter for screening mammogram for malignant neoplasm of breast: Secondary | ICD-10-CM

## 2018-07-24 ENCOUNTER — Encounter: Payer: Self-pay | Admitting: Obstetrics & Gynecology

## 2018-07-24 ENCOUNTER — Ambulatory Visit (INDEPENDENT_AMBULATORY_CARE_PROVIDER_SITE_OTHER): Payer: Federal, State, Local not specified - PPO | Admitting: Obstetrics & Gynecology

## 2018-07-24 VITALS — BP 120/80 | Ht 65.75 in | Wt 156.0 lb

## 2018-07-24 DIAGNOSIS — Z01419 Encounter for gynecological examination (general) (routine) without abnormal findings: Secondary | ICD-10-CM | POA: Diagnosis not present

## 2018-07-24 DIAGNOSIS — Z79890 Hormone replacement therapy (postmenopausal): Secondary | ICD-10-CM

## 2018-07-24 DIAGNOSIS — N951 Menopausal and female climacteric states: Secondary | ICD-10-CM | POA: Diagnosis not present

## 2018-07-24 DIAGNOSIS — M8589 Other specified disorders of bone density and structure, multiple sites: Secondary | ICD-10-CM

## 2018-07-24 MED ORDER — PROGESTERONE MICRONIZED 100 MG PO CAPS: 100.0000 mg | ORAL_CAPSULE | Freq: Every day | ORAL | 4 refills | Status: DC

## 2018-07-24 MED ORDER — ESTRADIOL 0.05 MG/24HR TD PTTW: 1.0000 | MEDICATED_PATCH | TRANSDERMAL | 4 refills | Status: DC

## 2018-07-24 NOTE — Patient Instructions (Signed)
1. Well female exam with routine gynecological exam Normal gynecologic exam and menopause.  Pap test was negative in January 2019.  No indication to repeat a Pap test this year.  Breast normal.  Mammogram January 2020 was negative.  Colonoscopy normal in 2015.  Health labs with family physician.  Good body mass index at 25.37.  Continue with fitness and healthy nutrition.  2. Menopausal syndrome on hormone replacement therapy No postmenopausal bleeding.  Well on hormone replacement therapy with Vivelle-Dot 0.05 twice a week and progesterone 100 mg capsule daily at bedtime.  No contraindication to continue on hormone replacement therapy.  Prescription sent to pharmacy.  3. Osteopenia of multiple sites Bone density in February 2019 showed osteopenia at the right femoral neck with a T score of -1.4.  Recommend continuing on vitamin D supplements, calcium intake of 1.5 g a day including nutritional and supplemental, regular weightbearing physical activities.  We will repeat a bone density in February 2021.  Other orders - cholecalciferol (VITAMIN D3) 25 MCG (1000 UT) tablet; Take 1,000 Units by mouth daily. - estradiol (VIVELLE-DOT) 0.05 MG/24HR patch; Place 1 patch (0.05 mg total) onto the skin 2 (two) times a week. - progesterone (PROMETRIUM) 100 MG capsule; Take 1 capsule (100 mg total) by mouth at bedtime.  Danielle [name], it was a pleasure seeing you today!

## 2018-07-24 NOTE — Progress Notes (Signed)
Danielle BernardBarbara C [name] [birth year] [id number]   History:    53 y.o. G3P2A1L2 Married.  Vasectomy  RP:  Established patient presenting for annual gyn exam   HPI:  Menopause, well on HRT with Vivelle dot 0.05 and Prometrium 100 mg HS.  No PMB.  No pelvic pain.  No pain with IC.  Breasts normal.  BMI 25.37.  Good fitness.  Healthy nutrition.  Health labs with Fam MD.  Past medical history,surgical history, family history and social history were all reviewed and documented in the EPIC chart.  Gynecologic History Patient's last menstrual period was 05/25/2015. Contraception: vasectomy Last Pap: 07/2017. Results were: Negative Last mammogram: 07/2018. Results were: Negative Bone Density:  08/2017 Osteopenia Rt Femoral neck T-Score -1.4 Colonoscopy: 2015, 10 yr schedule  Obstetric History OB History  Gravida Para Term Preterm AB Living  3 2     1 2   SAB TAB Ectopic Multiple Live Births  1            # Outcome Date GA Lbr Len/2nd Weight Sex Delivery Anes PTL Lv  3 SAB           2 Para           1 Para              ROS: A ROS was performed and pertinent positives and negatives are included in the history.  GENERAL: No fevers or chills. HEENT: No change in vision, no earache, sore throat or sinus congestion. NECK: No pain or stiffness. CARDIOVASCULAR: No chest pain or pressure. No palpitations. PULMONARY: No shortness of breath, cough or wheeze. GASTROINTESTINAL: No abdominal pain, nausea, vomiting or diarrhea, melena or bright red blood per rectum. GENITOURINARY: No urinary frequency, urgency, hesitancy or dysuria. MUSCULOSKELETAL: No joint or muscle pain, no back pain, no recent trauma. DERMATOLOGIC: No rash, no itching, no lesions. ENDOCRINE: No polyuria, polydipsia, no heat or cold intolerance. No recent change in weight. HEMATOLOGICAL: No anemia or easy bruising or bleeding. NEUROLOGIC: No headache, seizures, numbness, tingling or weakness. PSYCHIATRIC: No depression, no loss of interest in  normal activity or change in sleep pattern.     Exam:   BP 120/80   Ht 5' 5.75" (1.67 m)   Wt 156 lb (70.8 kg)   LMP 05/25/2015   BMI 25.37 kg/m   Body mass index is 25.37 kg/m.  General appearance : Well developed well nourished female. No acute distress HEENT: Eyes: no retinal hemorrhage or exudates,  Neck supple, trachea midline, no carotid bruits, no thyroidmegaly Lungs: Clear to auscultation, no rhonchi or wheezes, or rib retractions  Heart: Regular rate and rhythm, no murmurs or gallops Breast:Examined in sitting and supine position were symmetrical in appearance, no palpable masses or tenderness,  no skin retraction, no nipple inversion, no nipple discharge, no skin discoloration, no axillary or supraclavicular lymphadenopathy Abdomen: no palpable masses or tenderness, no rebound or guarding Extremities: no edema or skin discoloration or tenderness  Pelvic: Vulva: Normal             Vagina: No gross lesions or discharge  Cervix: No gross lesions or discharge  Uterus  AV, normal size, shape and consistency, non-tender and mobile  Adnexa  Without masses or tenderness  Anus: Normal   Assessment/Plan:  53 y.o. female for annual exam   1. Well female exam with routine gynecological exam Normal gynecologic exam and menopause.  Pap test was negative in January 2019.  No indication to repeat a Pap  test this year.  Breast normal.  Mammogram January 2020 was negative.  Colonoscopy normal in 2015.  Health labs with family physician.  Good body mass index at 25.37.  Continue with fitness and healthy nutrition.  2. Menopausal syndrome on hormone replacement therapy No postmenopausal bleeding.  Well on hormone replacement therapy with Vivelle-Dot 0.05 twice a week and progesterone 100 mg capsule daily at bedtime.  No contraindication to continue on hormone replacement therapy.  Prescription sent to pharmacy.  3. Osteopenia of multiple sites Bone density in February 2019 showed  osteopenia at the right femoral neck with a T score of -1.4.  Recommend continuing on vitamin D supplements, calcium intake of 1.5 g a day including nutritional and supplemental, regular weightbearing physical activities.  We will repeat a bone density in February 2021.  Other orders - cholecalciferol (VITAMIN D3) 25 MCG (1000 UT) tablet; Take 1,000 Units by mouth daily. - estradiol (VIVELLE-DOT) 0.05 MG/24HR patch; Place 1 patch (0.05 mg total) onto the skin 2 (two) times a week. - progesterone (PROMETRIUM) 100 MG capsule; Take 1 capsule (100 mg total) by mouth at bedtime.  Genia Del MD, 12:21 PM 07/24/2018

## 2018-08-24 DIAGNOSIS — L570 Actinic keratosis: Secondary | ICD-10-CM | POA: Diagnosis not present

## 2018-09-02 DIAGNOSIS — R42 Dizziness and giddiness: Secondary | ICD-10-CM | POA: Diagnosis not present

## 2018-09-06 ENCOUNTER — Other Ambulatory Visit: Payer: Self-pay | Admitting: Obstetrics & Gynecology

## 2018-09-07 DIAGNOSIS — R42 Dizziness and giddiness: Secondary | ICD-10-CM | POA: Diagnosis not present

## 2018-09-07 DIAGNOSIS — H8110 Benign paroxysmal vertigo, unspecified ear: Secondary | ICD-10-CM | POA: Diagnosis not present

## 2018-09-07 DIAGNOSIS — H6123 Impacted cerumen, bilateral: Secondary | ICD-10-CM | POA: Diagnosis not present

## 2018-09-09 DIAGNOSIS — R42 Dizziness and giddiness: Secondary | ICD-10-CM | POA: Diagnosis not present

## 2019-03-24 ENCOUNTER — Encounter: Payer: Self-pay | Admitting: Gynecology

## 2019-06-14 ENCOUNTER — Other Ambulatory Visit: Payer: Self-pay | Admitting: Obstetrics & Gynecology

## 2019-06-14 DIAGNOSIS — Z1231 Encounter for screening mammogram for malignant neoplasm of breast: Secondary | ICD-10-CM

## 2019-06-15 DIAGNOSIS — Z0000 Encounter for general adult medical examination without abnormal findings: Secondary | ICD-10-CM | POA: Diagnosis not present

## 2019-06-15 DIAGNOSIS — E7800 Pure hypercholesterolemia, unspecified: Secondary | ICD-10-CM | POA: Diagnosis not present

## 2019-06-15 DIAGNOSIS — E559 Vitamin D deficiency, unspecified: Secondary | ICD-10-CM | POA: Diagnosis not present

## 2019-06-16 ENCOUNTER — Ambulatory Visit: Payer: Federal, State, Local not specified - PPO | Admitting: Family Medicine

## 2019-06-16 ENCOUNTER — Encounter: Payer: Self-pay | Admitting: Family Medicine

## 2019-06-16 ENCOUNTER — Other Ambulatory Visit: Payer: Self-pay

## 2019-06-16 VITALS — BP 112/82 | Ht 65.0 in | Wt 155.0 lb

## 2019-06-16 DIAGNOSIS — S46812A Strain of other muscles, fascia and tendons at shoulder and upper arm level, left arm, initial encounter: Secondary | ICD-10-CM | POA: Diagnosis not present

## 2019-06-16 MED ORDER — METHOCARBAMOL 500 MG PO TABS: 500.0000 mg | ORAL_TABLET | Freq: Three times a day (TID) | ORAL | 1 refills | Status: DC | PRN

## 2019-06-16 MED ORDER — DICLOFENAC SODIUM 75 MG PO TBEC: 75.0000 mg | DELAYED_RELEASE_TABLET | Freq: Two times a day (BID) | ORAL | 1 refills | Status: DC

## 2019-06-16 NOTE — Progress Notes (Signed)
PCP: [facility] @ [place]  Subjective:   HPI: Patient is a 53 y.o. female here for neck pain.  Patient reports about 2 weeks ago she woke up with pain on left side of neck, difficulty lifting head off pillow due to pain here. No acute injury or trauma. She's over the years had issues with pain on left side of body - at times in leg, low back, neck. Current pain causing some headaches. Not tried any exercises for this. 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 No radiation into left arm. No numbness, tingling, skin changes. Has been using heat and advil with minimal relief to date.  Past Medical History:  Diagnosis Date  . Osteopenia 08/2017   T score -1.4 FRAX 5.7% / 0.4%    Current Outpatient Medications on File Prior to Visit  Medication Sig Dispense Refill  . cholecalciferol (VITAMIN D3) 25 MCG (1000 UT) tablet Take 1,000 Units by mouth daily.    . progesterone (PROMETRIUM) 100 MG capsule Take 1 capsule (100 mg total) by mouth at bedtime. 90 capsule 4  . estradiol (VIVELLE-DOT) 0.05 MG/24HR patch APPLY 1 PATCH ONTO THE SKIN TWICE A WEEK 24 patch 3   No current facility-administered medications on file prior to visit.    Past Surgical History:  Procedure Laterality Date  . CESAREAN SECTION    . DILATION AND CURETTAGE OF UTERUS    . HEMORRHOIDECTOMY WITH HEMORRHOID BANDING    . HYSTEROSCOPY      No Known Allergies  Social History   Socioeconomic History  . Marital status: Married    Spouse name: Not on file  . Number of children: Not on file  . Years of education: Not on file  . Highest education level: Not on file  Occupational History  . Not on file  Tobacco Use  . Smoking status: Never Smoker  . Smokeless tobacco: Never Used  Substance and Sexual Activity  . Alcohol use: Yes    Comment: 12 drinks a week   . Drug use: Not on file  . Sexual activity: Yes    Partners: Male    Comment: 1st intercourse- 30, partners-  less  than 46,  married- 70 yrs   Other Topics Concern  . Not on file  Social History Narrative  . Not on file   Social Determinants of Health   Financial Resource Strain:   . Difficulty of Paying Living Expenses: Not on file  Food Insecurity:   . Worried About Charity fundraiser in the Last Year: Not on file  . Ran Out of Food in the Last Year: Not on file  Transportation Needs:   . Lack of Transportation (Medical): Not on file  . Lack of Transportation (Non-Medical): Not on file  Physical Activity:   . Days of Exercise per Week: Not on file  . Minutes of Exercise per Session: Not on file  Stress:   . Feeling of Stress : Not on file  Social Connections:   . Frequency of Communication with Friends and Family: Not on file  . Frequency of Social Gatherings with Friends and Family: Not on file  . Attends Religious Services: Not on file  . Active Member of Clubs or Organizations: Not on file  . Attends Archivist Meetings: Not on file  . Marital Status: Not on file  Intimate Partner Violence:   . Fear of Current or Ex-Partner: Not on file  . Emotionally Abused: Not  on file  . Physically Abused: Not on file  . Sexually Abused: Not on file    Family History  Problem Relation Age of Onset  . Breast cancer Cousin        maternal    BP 112/82   Ht 5\' 5"  (1.651 m)   Wt 155 lb (70.3 kg)   LMP 05/25/2015   BMI 25.79 kg/m   Review of Systems: See HPI above.     Objective:  Physical Exam:  Gen: NAD, comfortable in exam room  Neck: No gross deformity, swelling, bruising. TTP left trapezius, paraspinal cervical region .  No midline/bony TTP. Mild limitation of flexion, extension, left lateral rotation. BUE strength 5/5.   Sensation intact to light touch.   1+ equal reflexes in triceps, brachioradialis tendons, 2+ biceps. Negative spurlings. NV intact distal BUEs.  Left shoulder: No swelling, ecchymoses.  No gross deformity. No TTP. FROM. Negative Hawkins,  Neers. Negative Yergasons. Strength 5/5 with empty can and resisted internal/external rotation. NV intact distally.   Assessment & Plan:  1. Left sided neck pain - consistent with strain of left trapezius muscle.  Heat, diclofenac with robaxin as needed.  Home exercises reviewed.  Consider physical therapy if not improving.  F/u in 6 weeks.

## 2019-06-16 NOTE — Patient Instructions (Signed)
You strained your left trapezius muscle. Heat 15 minutes at a time 3-4 times a day. Diclofenac 75 mg twice a day with food for pain and inflammation - take for 7 days then as needed - don't take aleve or ibuprofen with this. Ok to take tylenol with this if needed though. Robaxin up to 3 times a day as needed for spasms. Do home exercises and stretches as directed. Let me know if you're not progressing as expected over the next 2-4 weeks and we can put in an order for physical therapy. Follow up with me in 6 weeks (can be a virtual visit if you're improving).

## 2019-07-23 ENCOUNTER — Other Ambulatory Visit: Payer: Self-pay

## 2019-07-26 ENCOUNTER — Encounter: Payer: Self-pay | Admitting: Obstetrics & Gynecology

## 2019-07-26 ENCOUNTER — Ambulatory Visit: Payer: Federal, State, Local not specified - PPO | Admitting: Obstetrics & Gynecology

## 2019-07-26 ENCOUNTER — Other Ambulatory Visit: Payer: Self-pay

## 2019-07-26 VITALS — BP 126/82 | Ht 65.75 in | Wt 156.0 lb

## 2019-07-26 DIAGNOSIS — Z79890 Hormone replacement therapy (postmenopausal): Secondary | ICD-10-CM | POA: Diagnosis not present

## 2019-07-26 DIAGNOSIS — Z01419 Encounter for gynecological examination (general) (routine) without abnormal findings: Secondary | ICD-10-CM

## 2019-07-26 DIAGNOSIS — N951 Menopausal and female climacteric states: Secondary | ICD-10-CM | POA: Diagnosis not present

## 2019-07-26 DIAGNOSIS — M8589 Other specified disorders of bone density and structure, multiple sites: Secondary | ICD-10-CM | POA: Diagnosis not present

## 2019-07-26 MED ORDER — PROGESTERONE MICRONIZED 100 MG PO CAPS: 100.0000 mg | ORAL_CAPSULE | Freq: Every day | ORAL | 4 refills | Status: DC

## 2019-07-26 MED ORDER — ESTRADIOL 0.05 MG/24HR TD PTTW: MEDICATED_PATCH | TRANSDERMAL | 4 refills | Status: DC

## 2019-07-26 NOTE — Progress Notes (Signed)
Danielle [name] [birth year] [id number]   History:    54 y.o. G3P2A1L2 Married.  Vasectomy  RP:  Established patient presenting for annual gyn exam   HPI:  Menopause, well on HRT with Vivelle dot 0.05 and Prometrium 100 mg HS.  No PMB.  No pelvic pain.  No pain with IC.  Breasts normal.  BMI 25.37 stable.  Good fitness, swimming.  Healthy nutrition.  Health labs with Fam MD.  Past medical history,surgical history, family history and social history were all reviewed and documented in the EPIC chart.  Gynecologic History Patient's last menstrual period was 05/25/2015.  Obstetric History OB History  Gravida Para Term Preterm AB Living  3 2     1 2   SAB TAB Ectopic Multiple Live Births  1            # Outcome Date GA Lbr Len/2nd Weight Sex Delivery Anes PTL Lv  3 SAB           2 Para           1 Para              ROS: A ROS was performed and pertinent positives and negatives are included in the history.  GENERAL: No fevers or chills. HEENT: No change in vision, no earache, sore throat or sinus congestion. NECK: No pain or stiffness. CARDIOVASCULAR: No chest pain or pressure. No palpitations. PULMONARY: No shortness of breath, cough or wheeze. GASTROINTESTINAL: No abdominal pain, nausea, vomiting or diarrhea, melena or bright red blood per rectum. GENITOURINARY: No urinary frequency, urgency, hesitancy or dysuria. MUSCULOSKELETAL: No joint or muscle pain, no back pain, no recent trauma. DERMATOLOGIC: No rash, no itching, no lesions. ENDOCRINE: No polyuria, polydipsia, no heat or cold intolerance. No recent change in weight. HEMATOLOGICAL: No anemia or easy bruising or bleeding. NEUROLOGIC: No headache, seizures, numbness, tingling or weakness. PSYCHIATRIC: No depression, no loss of interest in normal activity or change in sleep pattern.     Exam:   BP 126/82   Ht 5' 5.75" (1.67 m)   Wt 156 lb (70.8 kg)   LMP 05/25/2015   BMI 25.37 kg/m   Body mass index is 25.37  kg/m.  General appearance : Well developed well nourished female. No acute distress HEENT: Eyes: no retinal hemorrhage or exudates,  Neck supple, trachea midline, no carotid bruits, no thyroidmegaly Lungs: Clear to auscultation, no rhonchi or wheezes, or rib retractions  Heart: Regular rate and rhythm, no murmurs or gallops Breast:Examined in sitting and supine position were symmetrical in appearance, no palpable masses or tenderness,  no skin retraction, no nipple inversion, no nipple discharge, no skin discoloration, no axillary or supraclavicular lymphadenopathy Abdomen: no palpable masses or tenderness, no rebound or guarding Extremities: no edema or skin discoloration or tenderness  Pelvic: Vulva: Normal             Vagina: No gross lesions or discharge  Cervix: No gross lesions or discharge  Uterus  AV, normal size, shape and consistency, non-tender and mobile  Adnexa  Without masses or tenderness  Anus: Normal   Assessment/Plan:  54 y.o. female for annual exam   1. Well female exam with routine gynecological exam Normal gynecologic exam.  Pap test January 2019 was negative, will repeat next year.  Breast exam normal.  Screening mammogram January 2020 was negative.  Colonoscopy 2015.  Health labs with family physician.  Good body mass index at 25.37.  Continue with fitness and healthy  nutrition.  2. Menopausal syndrome on hormone replacement therapy Postmenopausal, well on hormone replacement therapy.  No contraindication to continue.  We will continue on estradiol patch 0.05 twice weekly and progesterone 100 mg capsule,1 capsule per mouth daily at bedtime.  Both prescriptions sent to pharmacy.  3. Osteopenia of multiple sites Very mild osteopenia on bone density February 2019.  Will repeat BD at 1 yrs, next year.  Vitamin D supplements, calcium intake of 1200 mg daily and regular weightbearing physical activity is recommended.  Other orders - rosuvastatin (CRESTOR) 10 MG tablet;  Take 10 mg by mouth daily. - estradiol (VIVELLE-DOT) 0.05 MG/24HR patch; APPLY 1 PATCH ONTO THE SKIN TWICE A WEEK - progesterone (PROMETRIUM) 100 MG capsule; Take 1 capsule (100 mg total) by mouth at bedtime.  [name] MD, 12:12 PM 07/26/2019

## 2019-07-30 ENCOUNTER — Encounter: Payer: Self-pay | Admitting: Obstetrics & Gynecology

## 2019-07-30 NOTE — Patient Instructions (Signed)
1. Well female exam with routine gynecological exam Normal gynecologic exam.  Pap test January 2019 was negative, will repeat next year.  Breast exam normal.  Screening mammogram January 2020 was negative.  Colonoscopy 2015.  Health labs with family physician.  Good body mass index at 25.37.  Continue with fitness and healthy nutrition.  2. Menopausal syndrome on hormone replacement therapy Postmenopausal, well on hormone replacement therapy.  No contraindication to continue.  We will continue on estradiol patch 0.05 twice weekly and progesterone 100 mg capsule,1 capsule per mouth daily at bedtime.  Both prescriptions sent to pharmacy.  3. Osteopenia of multiple sites Very mild osteopenia on bone density February 2019.  Will repeat BD at 1 yrs, next year.  Vitamin D supplements, calcium intake of 1200 mg daily and regular weightbearing physical activity is recommended.  Other orders - rosuvastatin (CRESTOR) 10 MG tablet; Take 10 mg by mouth daily. - estradiol (VIVELLE-DOT) 0.05 MG/24HR patch; APPLY 1 PATCH ONTO THE SKIN TWICE A WEEK - progesterone (PROMETRIUM) 100 MG capsule; Take 1 capsule (100 mg total) by mouth at bedtime.  Hanadi, it was a pleasure seeing you today!

## 2019-08-10 ENCOUNTER — Ambulatory Visit
Admission: RE | Admit: 2019-08-10 | Discharge: 2019-08-10 | Disposition: A | Discharge status: Home / Self Care | Payer: Federal, State, Local not specified - PPO | Source: Ambulatory Visit | Attending: Obstetrics & Gynecology | Admitting: Obstetrics & Gynecology

## 2019-08-10 ENCOUNTER — Other Ambulatory Visit: Payer: Self-pay

## 2019-08-10 DIAGNOSIS — Z1231 Encounter for screening mammogram for malignant neoplasm of breast: Secondary | ICD-10-CM

## 2019-10-02 ENCOUNTER — Other Ambulatory Visit: Payer: Self-pay | Admitting: Obstetrics & Gynecology

## 2020-03-28 DIAGNOSIS — M542 Cervicalgia: Secondary | ICD-10-CM | POA: Diagnosis not present

## 2020-03-28 DIAGNOSIS — M25512 Pain in left shoulder: Secondary | ICD-10-CM | POA: Diagnosis not present

## 2020-04-13 DIAGNOSIS — M25512 Pain in left shoulder: Secondary | ICD-10-CM | POA: Diagnosis not present

## 2020-04-13 DIAGNOSIS — M542 Cervicalgia: Secondary | ICD-10-CM | POA: Diagnosis not present

## 2020-04-20 DIAGNOSIS — M542 Cervicalgia: Secondary | ICD-10-CM | POA: Diagnosis not present

## 2020-04-20 DIAGNOSIS — M25512 Pain in left shoulder: Secondary | ICD-10-CM | POA: Diagnosis not present

## 2020-05-05 DIAGNOSIS — M5412 Radiculopathy, cervical region: Secondary | ICD-10-CM | POA: Diagnosis not present

## 2020-05-10 DIAGNOSIS — M5412 Radiculopathy, cervical region: Secondary | ICD-10-CM | POA: Diagnosis not present

## 2020-05-17 DIAGNOSIS — M542 Cervicalgia: Secondary | ICD-10-CM | POA: Diagnosis not present

## 2020-05-22 DIAGNOSIS — M542 Cervicalgia: Secondary | ICD-10-CM | POA: Diagnosis not present

## 2020-06-07 DIAGNOSIS — M5412 Radiculopathy, cervical region: Secondary | ICD-10-CM | POA: Diagnosis not present

## 2020-07-10 ENCOUNTER — Other Ambulatory Visit: Payer: Self-pay | Admitting: Obstetrics & Gynecology

## 2020-07-10 DIAGNOSIS — Z1231 Encounter for screening mammogram for malignant neoplasm of breast: Secondary | ICD-10-CM

## 2020-07-11 DIAGNOSIS — E7800 Pure hypercholesterolemia, unspecified: Secondary | ICD-10-CM | POA: Diagnosis not present

## 2020-07-11 DIAGNOSIS — Z0000 Encounter for general adult medical examination without abnormal findings: Secondary | ICD-10-CM | POA: Diagnosis not present

## 2020-07-11 DIAGNOSIS — R635 Abnormal weight gain: Secondary | ICD-10-CM | POA: Diagnosis not present

## 2020-07-26 ENCOUNTER — Other Ambulatory Visit: Payer: Self-pay

## 2020-07-26 ENCOUNTER — Encounter: Payer: Self-pay | Admitting: Obstetrics & Gynecology

## 2020-07-26 ENCOUNTER — Ambulatory Visit: Payer: Federal, State, Local not specified - PPO | Admitting: Obstetrics & Gynecology

## 2020-07-26 VITALS — BP 116/64 | HR 80 | Resp 14 | Ht 65.75 in | Wt 161.2 lb

## 2020-07-26 DIAGNOSIS — Z01419 Encounter for gynecological examination (general) (routine) without abnormal findings: Secondary | ICD-10-CM | POA: Diagnosis not present

## 2020-07-26 DIAGNOSIS — N951 Menopausal and female climacteric states: Secondary | ICD-10-CM

## 2020-07-26 DIAGNOSIS — M8589 Other specified disorders of bone density and structure, multiple sites: Secondary | ICD-10-CM

## 2020-07-26 DIAGNOSIS — Z79890 Hormone replacement therapy (postmenopausal): Secondary | ICD-10-CM

## 2020-07-26 MED ORDER — ESTRADIOL 0.05 MG/24HR TD PTTW: MEDICATED_PATCH | TRANSDERMAL | 4 refills | Status: DC

## 2020-07-26 MED ORDER — PROGESTERONE MICRONIZED 100 MG PO CAPS: 100.0000 mg | ORAL_CAPSULE | Freq: Every day | ORAL | 4 refills | Status: DC

## 2020-07-26 NOTE — Progress Notes (Signed)
Danielle [name] July 08, 1965 [id number]   History:    55 y.o. G3P2A1L2 Married. Vasectomy  BS:[id number] presenting for annual gyn exam   TSV:[id number], well on HRT with Vivelle dot 0.05 and Prometrium 100 mg HS. No PMB. No pelvic pain. No pain with IC. Breasts normal. BMI 26.22 stable. Good fitness, swimming. Healthy nutrition. Health labs with Fam MD.  Past medical history,surgical history, family history and social history were all reviewed and documented in the EPIC chart.  Gynecologic History Patient's last menstrual period was 05/25/2015.  Obstetric History OB History  Gravida Para Term Preterm AB Living  3 2     1 2   SAB IAB Ectopic Multiple Live Births  1            # Outcome Date GA Lbr Len/2nd Weight Sex Delivery Anes PTL Lv  3 SAB           2 Para           1 Para              ROS: A ROS was performed and pertinent positives and negatives are included in the history.  GENERAL: No fevers or chills. HEENT: No change in vision, no earache, sore throat or sinus congestion. NECK: No pain or stiffness. CARDIOVASCULAR: No chest pain or pressure. No palpitations. PULMONARY: No shortness of breath, cough or wheeze. GASTROINTESTINAL: No abdominal pain, nausea, vomiting or diarrhea, melena or bright red blood per rectum. GENITOURINARY: No urinary frequency, urgency, hesitancy or dysuria. MUSCULOSKELETAL: No joint or muscle pain, no back pain, no recent trauma. DERMATOLOGIC: No rash, no itching, no lesions. ENDOCRINE: No polyuria, polydipsia, no heat or cold intolerance. No recent change in weight. HEMATOLOGICAL: No anemia or easy bruising or bleeding. NEUROLOGIC: No headache, seizures, numbness, tingling or weakness. PSYCHIATRIC: No depression, no loss of interest in normal activity or change in sleep pattern.     Exam:   BP 116/64 (BP Location: Right Arm, Patient Position: Sitting, Cuff Size: Normal)   Pulse 80   Resp 14   Ht 5' 5.75" (1.67 m)    Wt 161 lb 4 oz (73.1 kg)   LMP 05/25/2015   BMI 26.22 kg/m   Body mass index is 26.22 kg/m.  General appearance : Well developed well nourished female. No acute distress HEENT: Eyes: no retinal hemorrhage or exudates,  Neck supple, trachea midline, no carotid bruits, no thyroidmegaly Lungs: Clear to auscultation, no rhonchi or wheezes, or rib retractions  Heart: Regular rate and rhythm, no murmurs or gallops Breast:Examined in sitting and supine position were symmetrical in appearance, no palpable masses or tenderness,  no skin retraction, no nipple inversion, no nipple discharge, no skin discoloration, no axillary or supraclavicular lymphadenopathy Abdomen: no palpable masses or tenderness, no rebound or guarding Extremities: no edema or skin discoloration or tenderness  Pelvic: Vulva: Normal             Vagina: No gross lesions or discharge  Cervix: No gross lesions or discharge.  Pap reflex done.  Uterus  AV, normal size, shape and consistency, non-tender and mobile  Adnexa  Without masses or tenderness  Anus: Normal   Assessment/Plan:  55 y.o. female for annual exam   1. Encounter for routine gynecological examination with Papanicolaou smear of cervix Normal gynecologic exam.  Pap reflex done.  Breast exam normal.  Screening mammogram scheduled for August 18, 2020.  Colonoscopy 2015.  Health labs with family physician.  Good body mass index at  26.22.  Continue with fitness and healthy nutrition. - Pap IG w/ reflex to HPV when ASC-U  2. Menopausal syndrome on hormone replacement therapy Well on no hormone replacement therapy.  No contraindication to continue.  Same hormone replacement therapy prescribed.  3. Osteopenia of multiple sites Very mild Osteopenia on BD 08/2017, will repeat at 5 years.  Vitamin D supplements, calcium intake of a total of 1500 mg daily and regular weightbearing physical activities.  Other orders - naproxen (NAPROSYN) 500 MG tablet; Take 500 mg by  mouth 2 (two) times daily. - estradiol (VIVELLE-DOT) 0.05 MG/24HR patch; APPLY 1 PATCH ONTO THE SKIN TWICE A WEEK - progesterone (PROMETRIUM) 100 MG capsule; Take 1 capsule (100 mg total) by mouth at bedtime.  Genia Del MD, 12:01 PM 07/26/2020

## 2020-07-27 LAB — PAP IG W/ RFLX HPV ASCU

## 2020-07-29 ENCOUNTER — Encounter: Payer: Self-pay | Admitting: Obstetrics & Gynecology

## 2020-08-18 ENCOUNTER — Other Ambulatory Visit: Payer: Self-pay

## 2020-08-18 ENCOUNTER — Ambulatory Visit
Admission: RE | Admit: 2020-08-18 | Discharge: 2020-08-18 | Disposition: A | Discharge status: Home / Self Care | Payer: Federal, State, Local not specified - PPO | Source: Ambulatory Visit | Attending: Obstetrics & Gynecology | Admitting: Obstetrics & Gynecology

## 2020-08-18 DIAGNOSIS — Z1231 Encounter for screening mammogram for malignant neoplasm of breast: Secondary | ICD-10-CM | POA: Diagnosis not present

## 2020-09-11 ENCOUNTER — Ambulatory Visit: Payer: Self-pay

## 2020-09-11 ENCOUNTER — Other Ambulatory Visit: Payer: Self-pay

## 2020-09-11 ENCOUNTER — Encounter: Payer: Self-pay | Admitting: Family Medicine

## 2020-09-11 ENCOUNTER — Ambulatory Visit: Payer: Federal, State, Local not specified - PPO | Admitting: Family Medicine

## 2020-09-11 ENCOUNTER — Ambulatory Visit
Admission: RE | Admit: 2020-09-11 | Discharge: 2020-09-11 | Disposition: A | Discharge status: Home / Self Care | Payer: Federal, State, Local not specified - PPO | Source: Ambulatory Visit | Attending: Family Medicine | Admitting: Family Medicine

## 2020-09-11 VITALS — BP 112/78 | Ht 65.0 in | Wt 155.0 lb

## 2020-09-11 DIAGNOSIS — G8929 Other chronic pain: Secondary | ICD-10-CM

## 2020-09-11 DIAGNOSIS — M25571 Pain in right ankle and joints of right foot: Secondary | ICD-10-CM | POA: Diagnosis not present

## 2020-09-11 DIAGNOSIS — M79672 Pain in left foot: Secondary | ICD-10-CM | POA: Diagnosis not present

## 2020-09-11 NOTE — Patient Instructions (Signed)
Get x-rays of your right ankle after you leave today (to assess for a loose body, osteochondral defect). If these are normal your pain is related to ankle instability causing impingement. This typically responds to strengthening which you should be able to do just at home. Do the exercises with theraband as directed daily for next 6 weeks. No restrictions on activities otherwise.  Your left foot pain is due to either a low grade sesamoiditis or flexor tendinopathy. Both are treated similarly. We placed a first ray post for support and extra cushion. If this does not provide enough benefit the alternative is a cutout for the sesamoids. Icing, tylenol, motrin if needed. No restrictions on activities.

## 2020-09-11 NOTE — Progress Notes (Signed)
PCP: Lorenda Ishihara, MD  Subjective:   HPI: Patient is a 55 y.o. female here for right ankle, left foot pain.  Patient sustained an inversion injury to her right ankle a few years ago. Ever since that time (especially in past month) she's had increased issues with anterolateral right ankle. At times feels like it locks up and has a fire quality of pain. Associated cracking sounds as well. No new injuries. Left foot has been bothering her recently since doing lunges - points underneath great toe as area of pain. Has history of plantar fasciitis of left foot but this is different.  Past Medical History:  Diagnosis Date  . Osteopenia 08/2017   T score -1.4 FRAX 5.7% / 0.4%    Current Outpatient Medications on File Prior to Visit  Medication Sig Dispense Refill  . cholecalciferol (VITAMIN D3) 25 MCG (1000 UT) tablet Take 1,000 Units by mouth daily.    Marland Kitchen estradiol (VIVELLE-DOT) 0.05 MG/24HR patch APPLY 1 PATCH ONTO THE SKIN TWICE A WEEK 24 patch 4  . naproxen (NAPROSYN) 500 MG tablet Take 500 mg by mouth 2 (two) times daily.    . progesterone (PROMETRIUM) 100 MG capsule Take 1 capsule (100 mg total) by mouth at bedtime. 90 capsule 4  . progesterone (PROMETRIUM) 100 MG capsule Take 1 capsule (100 mg total) by mouth at bedtime. 90 capsule 4  . rosuvastatin (CRESTOR) 10 MG tablet Take 10 mg by mouth daily.     No current facility-administered medications on file prior to visit.    Past Surgical History:  Procedure Laterality Date  . CESAREAN SECTION    . DILATION AND CURETTAGE OF UTERUS    . HEMORRHOIDECTOMY WITH HEMORRHOID BANDING    . HYSTEROSCOPY      No Known Allergies  Social History   Socioeconomic History  . Marital status: Married    Spouse name: Not on file  . Number of children: Not on file  . Years of education: Not on file  . Highest education level: Not on file  Occupational History  . Not on file  Tobacco Use  . Smoking status: Never Smoker  .  Smokeless tobacco: Never Used  Vaping Use  . Vaping Use: Never used  Substance and Sexual Activity  . Alcohol use: Yes    Comment: 12 drinks a week   . Drug use: Not on file  . Sexual activity: Yes    Partners: Male    Comment: 1st intercourse- 66, partners-  less than 10,  married- 27 yrs   Other Topics Concern  . Not on file  Social History Narrative  . Not on file   Social Determinants of Health   Financial Resource Strain: Not on file  Food Insecurity: Not on file  Transportation Needs: Not on file  Physical Activity: Not on file  Stress: Not on file  Social Connections: Not on file  Intimate Partner Violence: Not on file    Family History  Problem Relation Age of Onset  . Breast cancer Cousin        maternal    BP 112/78   Ht 5\' 5"  (1.651 m)   Wt 155 lb (70.3 kg)   LMP 05/25/2015   BMI 25.79 kg/m   No flowsheet data found.  No flowsheet data found.  Review of Systems: See HPI above.     Objective:  Physical Exam:  Gen: NAD, comfortable in exam room  Right ankle: No gross deformity, swelling, ecchymoses FROM with full  strength. TTP anteriorly over talar dome/ankle joint.  No other tenderness. Trace ant drawer and negative talar tilt.   Negative syndesmotic compression. Thompsons test negative. NV intact distally.  Left foot/ankle: No gross deformity, swelling, ecchymoses FROM without pain on resisted ankle and great toe motions No TTP currently - points over sesamoids as area of pain Negative ant drawer and negative talar tilt.   Thompsons test negative. NV intact distally.  Limited MSK u/s left foot: sesamoids appear normal without cortical irregularity, edema, neovascularity.  No target sign of flexor hallucis longus.   Assessment & Plan:  1. Right ankle pain - will obtain radiographs to assess for OCD, loose body, arthropathy.  If normal recommend starting home exercise program for strengthening due to mild instability.  F/u in 6 weeks if  not improving.  2. Left foot pain - ultrasound reassuring.  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  First ray post placed in her orthotic on left.  Consider sesamoid cutout instead of not improving.  Icing, tylenol, motrin if needed.

## 2020-09-15 ENCOUNTER — Other Ambulatory Visit: Payer: Self-pay | Admitting: Obstetrics & Gynecology

## 2020-09-15 NOTE — Telephone Encounter (Signed)
Refill request received for Prometrium 100 mg cap nightly.  Last Rx sent on 07/26/20 for #90/4RF.   Call placed to [facility], spoke with [place]. Confirmed Rx on file with refills, was advised to disregard refill request.   Rx denied.   Encounter closed.

## 2020-12-29 DIAGNOSIS — K080 Exfoliation of teeth due to systemic causes: Secondary | ICD-10-CM | POA: Diagnosis not present

## 2021-07-17 ENCOUNTER — Other Ambulatory Visit: Payer: Self-pay | Admitting: Internal Medicine

## 2021-07-17 DIAGNOSIS — Z0000 Encounter for general adult medical examination without abnormal findings: Secondary | ICD-10-CM | POA: Diagnosis not present

## 2021-07-17 DIAGNOSIS — Z1231 Encounter for screening mammogram for malignant neoplasm of breast: Secondary | ICD-10-CM

## 2021-07-17 DIAGNOSIS — E7800 Pure hypercholesterolemia, unspecified: Secondary | ICD-10-CM | POA: Diagnosis not present

## 2021-07-27 ENCOUNTER — Ambulatory Visit (INDEPENDENT_AMBULATORY_CARE_PROVIDER_SITE_OTHER): Payer: Federal, State, Local not specified - PPO | Admitting: Obstetrics & Gynecology

## 2021-07-27 ENCOUNTER — Encounter: Payer: Self-pay | Admitting: Obstetrics & Gynecology

## 2021-07-27 ENCOUNTER — Other Ambulatory Visit: Payer: Self-pay

## 2021-07-27 VITALS — BP 104/64 | HR 68 | Resp 16 | Ht 65.5 in | Wt 160.0 lb

## 2021-07-27 DIAGNOSIS — Z79890 Hormone replacement therapy (postmenopausal): Secondary | ICD-10-CM

## 2021-07-27 DIAGNOSIS — M8589 Other specified disorders of bone density and structure, multiple sites: Secondary | ICD-10-CM

## 2021-07-27 DIAGNOSIS — Z01419 Encounter for gynecological examination (general) (routine) without abnormal findings: Secondary | ICD-10-CM | POA: Diagnosis not present

## 2021-07-27 DIAGNOSIS — N951 Menopausal and female climacteric states: Secondary | ICD-10-CM | POA: Diagnosis not present

## 2021-07-27 MED ORDER — ESTRADIOL 0.05 MG/24HR TD PTTW: MEDICATED_PATCH | TRANSDERMAL | 4 refills | Status: DC

## 2021-07-27 MED ORDER — PROGESTERONE MICRONIZED 100 MG PO CAPS: 100.0000 mg | ORAL_CAPSULE | Freq: Every day | ORAL | 4 refills | Status: DC

## 2021-07-27 NOTE — Progress Notes (Signed)
Danielle [name] [birth year] [id number]   History:    56 y.o.  G3P2A1L2 Married.  Vasectomy   RP:  Established patient presenting for annual gyn exam    HPI:  Postmenopause, well on HRT with Vivelle dot 0.05 and Prometrium 100 mg HS.  No PMB.  No pelvic pain.  No pain with IC.  Pap Neg 07/2020. Breasts normal.  Screening mammo Neg 08/2020.  BMI 26.22 stable.  Good fitness, swimming. Healthy nutrition. Bone Density Osteopenia T-Score -1.4 in 08/2017.  Will schedule BD here now. Health labs with Fam MD.  Danielle [name] 2015.    Past medical history,surgical history, family history and social history were all reviewed and documented in the EPIC chart.  Gynecologic History Patient's last menstrual period was 05/25/2015.  Obstetric History OB History  Gravida Para Term Preterm AB Living  3 2     1 2   SAB IAB Ectopic Multiple Live Births  1            # Outcome Date GA Lbr Len/2nd Weight Sex Delivery Anes PTL Lv  3 SAB           2 Para           1 Para              ROS: A ROS was performed and pertinent positives and negatives are included in the history.  GENERAL: No fevers or chills. HEENT: No change in vision, no earache, sore throat or sinus congestion. NECK: No pain or stiffness. CARDIOVASCULAR: No chest pain or pressure. No palpitations. PULMONARY: No shortness of breath, cough or wheeze. GASTROINTESTINAL: No abdominal pain, nausea, vomiting or diarrhea, melena or bright red blood per rectum. GENITOURINARY: No urinary frequency, urgency, hesitancy or dysuria. MUSCULOSKELETAL: No joint or muscle pain, no back pain, no recent trauma. DERMATOLOGIC: No rash, no itching, no lesions. ENDOCRINE: No polyuria, polydipsia, no heat or cold intolerance. No recent change in weight. HEMATOLOGICAL: No anemia or easy bruising or bleeding. NEUROLOGIC: No headache, seizures, numbness, tingling or weakness. PSYCHIATRIC: No depression, no loss of interest in normal activity or change in sleep pattern.      Exam:   BP 104/64    Pulse 68    Resp 16    Ht 5' 5.5" (1.664 m)    Wt 160 lb (72.6 kg)    LMP 05/25/2015    BMI 26.22 kg/m   Body mass index is 26.22 kg/m.  General appearance : Well developed well nourished female. No acute distress HEENT: Eyes: no retinal hemorrhage or exudates,  Neck supple, trachea midline, no carotid bruits, no thyroidmegaly Lungs: Clear to auscultation, no rhonchi or wheezes, or rib retractions  Heart: Regular rate and rhythm, no murmurs or gallops Breast:Examined in sitting and supine position were symmetrical in appearance, no palpable masses or tenderness,  no skin retraction, no nipple inversion, no nipple discharge, no skin discoloration, no axillary or supraclavicular lymphadenopathy Abdomen: no palpable masses or tenderness, no rebound or guarding Extremities: no edema or skin discoloration or tenderness  Pelvic: Vulva: Normal             Vagina: No gross lesions or discharge  Cervix: No gross lesions or discharge  Uterus  AV, normal size, shape and consistency, non-tender and mobile  Adnexa  Without masses or tenderness  Anus: Normal   Assessment/Plan:  56 y.o. female for annual exam   1. Well female exam with routine gynecological exam Postmenopause, well on HRT with Vivelle  dot 0.05 and Prometrium 100 mg HS.  No PMB.  No pelvic pain.  No pain with IC.  Pap Neg 07/2020. Breasts normal.  Screening mammo Neg 08/2020. BMI 26.22 stable.  Good fitness, swimming. Healthy nutrition. Bone Density Osteopenia T-Score -1.4 in 08/2017.  Will schedule BD here now. Health labs with Fam MD.  Danielle [name] 2015.   2. Menopausal syndrome on hormone replacement therapy Postmenopause, well on HRT with Vivelle dot 0.05 and Prometrium 100 mg HS.  No PMB.  No pelvic pain.  No pain with IC. No CI to continue on HRT.  Prescriptions sent to pharmacy.  3. Osteopenia of multiple sites Good fitness, swimming. Healthy nutrition. Bone Density Osteopenia T-Score -1.4 in 08/2017.  Will  schedule BD here now. Vit D and Ca++ total 1.5 g/d. - DG Bone Density; Future  Other orders - progesterone (PROMETRIUM) 100 MG capsule; Take 1 capsule (100 mg total) by mouth at bedtime. - estradiol (VIVELLE-DOT) 0.05 MG/24HR patch; APPLY 1 PATCH ONTO THE SKIN TWICE A WEEK   [name] MD, 12:12 PM 07/27/2021

## 2021-08-24 ENCOUNTER — Ambulatory Visit
Admission: RE | Admit: 2021-08-24 | Discharge: 2021-08-24 | Disposition: A | Discharge status: Home / Self Care | Payer: Federal, State, Local not specified - PPO | Source: Ambulatory Visit

## 2021-08-24 ENCOUNTER — Other Ambulatory Visit: Payer: Self-pay

## 2021-08-24 DIAGNOSIS — Z1231 Encounter for screening mammogram for malignant neoplasm of breast: Secondary | ICD-10-CM

## 2021-08-28 ENCOUNTER — Other Ambulatory Visit: Payer: Self-pay

## 2021-08-28 ENCOUNTER — Other Ambulatory Visit: Payer: Self-pay | Admitting: Obstetrics & Gynecology

## 2021-08-28 ENCOUNTER — Ambulatory Visit (INDEPENDENT_AMBULATORY_CARE_PROVIDER_SITE_OTHER): Payer: Federal, State, Local not specified - PPO

## 2021-08-28 DIAGNOSIS — M85852 Other specified disorders of bone density and structure, left thigh: Secondary | ICD-10-CM

## 2021-08-28 DIAGNOSIS — M8589 Other specified disorders of bone density and structure, multiple sites: Secondary | ICD-10-CM

## 2021-08-28 DIAGNOSIS — Z780 Asymptomatic menopausal state: Secondary | ICD-10-CM | POA: Diagnosis not present

## 2021-08-28 DIAGNOSIS — M85851 Other specified disorders of bone density and structure, right thigh: Secondary | ICD-10-CM

## 2022-05-29 ENCOUNTER — Ambulatory Visit: Payer: Federal, State, Local not specified - PPO | Admitting: Family Medicine

## 2022-05-29 VITALS — BP 102/70 | Ht 65.0 in | Wt 155.0 lb

## 2022-05-29 DIAGNOSIS — M25511 Pain in right shoulder: Secondary | ICD-10-CM

## 2022-05-29 DIAGNOSIS — G8929 Other chronic pain: Secondary | ICD-10-CM | POA: Diagnosis not present

## 2022-05-29 DIAGNOSIS — M25562 Pain in left knee: Secondary | ICD-10-CM | POA: Diagnosis not present

## 2022-05-29 NOTE — Progress Notes (Cosign Needed)
  SUBJECTIVE:   Chief Complaint: right shoulder and left knee pain  History of Presenting Illness:   [name] is a 56 year old female with a past medical history of sacroiliitis, sciatica, plantar fasciitis, biceps tendinitis, tibialis anterior tenosynovitis, and R ankle sprain who presents with right shoulder and left knee pain.  Her right shoulder pain started gradually about eight months ago without an obvious cause such as trauma or fall. It feels like a sharp stabbing sensation in the front of her shoulder that worsens with certain movements, specifically reaching forward or sideways. Other activities like swimming are okay. Denies noticeable weakness, numbness, tingling, redness, and swelling. She has tried naproxen, which was not helpful. Has not tried ice, heat, or stretching.  Her left knee started to hurt after increasing weight from 20lbs to 30lbs on the knee extension machine. She has since decreased the weight back to 20lbs and her pain resolved. It has happened four times over the last few weeks. The pain, when present, is located over the top of her kneecap. Denies redness or swelling. She has not tried any medications, exercises, or thermotherapy.   Pertinent Medical History:   Past Medical History:  Diagnosis Date   Osteopenia 08/2017   T score -1.4 FRAX 5.7% / 0.4%      OBJECTIVE:   LMP 05/25/2015   Physical Examination:  Right shoulder No erythema or swelling, no atrophy, tenderness to palpation over acromioclavicular joint but absent on repeat examination, mild tenderness to palpation over anterolateral aspect of distal humerus, RoM full with flexion-extension-abduction-adduction and external-internal rotation, pain with active movement in last few degrees of abduction, strength 5/5, no pain with active movement against resistance in any direction, empty can test negative, crossover test negative.  Negative hawkins, neers, thessalys, speed, apprehension,  o'briens.  Left knee No erythema or swelling, no tenderness to palpation, RoM full with flexion and extension, no varus or valgus laxity, sensation to light touch intact across dermatomes L4-S1, strength 5/5 with flexion and extension  ASSESSMENT/PLAN:   Patient's shoulder pain is most consistent with either mild acromioclavicular osteoarthritis or mild impingement.  > Home strengthening exercises > Naproxen as needed for acute pain > Continue current exercise regimen while avoiding exacerbating movements  > Return to [facility] in about six weeks  Patient's knee pain is most consistent with quadriceps tendinitis secondary to abrupt increase in training load.  > Increase knee extension weight more incrementally > Home exercises targeting vastus medialis  There are no diagnoses linked to this encounter.  No follow-ups on file.  Lajuana Ripple, MD  05/29/2022, 11:06 AM

## 2022-05-29 NOTE — Patient Instructions (Signed)
Drop knee extensions down to 20 pounds and increase to 25 as tolerated. Do home exercises for inside quad muscle and hip - do these most days of the week for next 6 weeks.  Your shoulder exam is reassuring. This is due to mild arthritis or very mild impingement. Do home exercises as directed most days of the week for next 6 weeks. Aleve 1-2 tabs twice a day with food for pain and inflammation as needed. Follow up with me in 6 weeks.

## 2022-05-30 ENCOUNTER — Encounter: Payer: Self-pay | Admitting: Family Medicine

## 2022-07-23 ENCOUNTER — Other Ambulatory Visit: Payer: Self-pay | Admitting: Internal Medicine

## 2022-07-23 DIAGNOSIS — Z1231 Encounter for screening mammogram for malignant neoplasm of breast: Secondary | ICD-10-CM

## 2022-08-02 ENCOUNTER — Ambulatory Visit: Payer: Federal, State, Local not specified - PPO | Admitting: Obstetrics & Gynecology

## 2022-08-08 ENCOUNTER — Other Ambulatory Visit: Payer: Self-pay | Admitting: Obstetrics & Gynecology

## 2022-08-08 DIAGNOSIS — Z79890 Hormone replacement therapy (postmenopausal): Secondary | ICD-10-CM

## 2022-08-09 NOTE — Telephone Encounter (Signed)
ML pt. Last AEX 07/27/2021--scheduled for 09/06/2022. Last mammo 08/24/2021--neg birads 1, scheduled for 09/10/2022.   Rxs pending

## 2022-08-16 DIAGNOSIS — E7800 Pure hypercholesterolemia, unspecified: Secondary | ICD-10-CM | POA: Diagnosis not present

## 2022-08-16 DIAGNOSIS — E559 Vitamin D deficiency, unspecified: Secondary | ICD-10-CM | POA: Diagnosis not present

## 2022-08-16 DIAGNOSIS — Z0000 Encounter for general adult medical examination without abnormal findings: Secondary | ICD-10-CM | POA: Diagnosis not present

## 2022-08-16 DIAGNOSIS — Z23 Encounter for immunization: Secondary | ICD-10-CM | POA: Diagnosis not present

## 2022-09-06 ENCOUNTER — Ambulatory Visit (INDEPENDENT_AMBULATORY_CARE_PROVIDER_SITE_OTHER): Payer: Federal, State, Local not specified - PPO | Admitting: Obstetrics & Gynecology

## 2022-09-06 ENCOUNTER — Encounter: Payer: Self-pay | Admitting: Obstetrics & Gynecology

## 2022-09-06 VITALS — BP 110/72 | HR 72 | Ht 65.25 in | Wt 158.0 lb

## 2022-09-06 DIAGNOSIS — N951 Menopausal and female climacteric states: Secondary | ICD-10-CM | POA: Diagnosis not present

## 2022-09-06 DIAGNOSIS — M8589 Other specified disorders of bone density and structure, multiple sites: Secondary | ICD-10-CM

## 2022-09-06 DIAGNOSIS — Z79890 Hormone replacement therapy (postmenopausal): Secondary | ICD-10-CM

## 2022-09-06 DIAGNOSIS — Z01419 Encounter for gynecological examination (general) (routine) without abnormal findings: Secondary | ICD-10-CM | POA: Diagnosis not present

## 2022-09-06 MED ORDER — ESTRADIOL 0.05 MG/24HR TD PTTW: MEDICATED_PATCH | TRANSDERMAL | 4 refills | Status: AC

## 2022-09-06 MED ORDER — PROGESTERONE MICRONIZED 100 MG PO CAPS: ORAL_CAPSULE | ORAL | 4 refills | Status: AC

## 2022-09-06 NOTE — Progress Notes (Signed)
DAMA [name] 11/14/1965 [id number]   History:    57 y.o. G3P2A1L2 Married.  Vasectomy   RP:  Established patient presenting for annual gyn exam    HPI:  Postmenopause, well on HRT x 5 years, with Vivelle dot 0.05 and Prometrium 100 mg HS.  No PMB. No pelvic pain.  No pain with IC.  Pap Neg 07/2020. Breasts normal.  Screening mammo Neg 08/2021.  BMI 26.09 stable.  Good fitness, swimming. Healthy nutrition. Bone Density Osteopenia stable T-Score -1.3 at Rt Fem Neck in 08/2021.  Health labs with Fam MD.  Danielle [name] 2015.  Past medical history,surgical history, family history and social history were all reviewed and documented in the EPIC chart.  Gynecologic History Patient's last menstrual period was 05/25/2015.  Obstetric History OB History  Gravida Para Term Preterm AB Living  '3 2 2   1 2  '$ SAB IAB Ectopic Multiple Live Births  1            # Outcome Date GA Lbr Len/2nd Weight Sex Delivery Anes PTL Lv  3 SAB           2 Term           1 Term             ROS: A ROS was performed and pertinent positives and negatives are included in the history. GENERAL: No fevers or chills. HEENT: No change in vision, no earache, sore throat or sinus congestion. NECK: No pain or stiffness. CARDIOVASCULAR: No chest pain or pressure. No palpitations. PULMONARY: No shortness of breath, cough or wheeze. GASTROINTESTINAL: No abdominal pain, nausea, vomiting or diarrhea, melena or bright red blood per rectum. GENITOURINARY: No urinary frequency, urgency, hesitancy or dysuria. MUSCULOSKELETAL: No joint or muscle pain, no back pain, no recent trauma. DERMATOLOGIC: No rash, no itching, no lesions. ENDOCRINE: No polyuria, polydipsia, no heat or cold intolerance. No recent change in weight. HEMATOLOGICAL: No anemia or easy bruising or bleeding. NEUROLOGIC: No headache, seizures, numbness, tingling or weakness. PSYCHIATRIC: No depression, no loss of interest in normal activity or change in sleep pattern.      Exam:  BP 110/72   Pulse 72   Ht 5' 5.25" (1.657 m)   Wt 158 lb (71.7 kg)   LMP 05/25/2015 Comment: sexually active  SpO2 98%   BMI 26.09 kg/m   Body mass index is 26.09 kg/m.  General appearance : Well developed well nourished female. No acute distress HEENT: Eyes: no retinal hemorrhage or exudates,  Neck supple, trachea midline, no carotid bruits, no thyroidmegaly Lungs: Clear to auscultation, no rhonchi or wheezes, or rib retractions  Heart: Regular rate and rhythm, no murmurs or gallops Breast:Examined in sitting and supine position were symmetrical in appearance, no palpable masses or tenderness,  no skin retraction, no nipple inversion, no nipple discharge, no skin discoloration, no axillary or supraclavicular lymphadenopathy Abdomen: no palpable masses or tenderness, no rebound or guarding Extremities: no edema or skin discoloration or tenderness  Pelvic: Vulva: Normal             Vagina: No gross lesions or discharge  Cervix: No gross lesions or discharge  Uterus  AV, normal size, shape and consistency, non-tender and mobile  Adnexa  Without masses or tenderness  Anus: Normal   Assessment/Plan:  57 y.o. female for annual exam   1. Well female exam with routine gynecological exam Postmenopause, well on HRT x 5 years, with Vivelle dot 0.05 and Prometrium 100 mg HS.  No PMB. No pelvic pain.  No pain with IC.  Pap Neg 07/2020. Breasts normal.  Screening mammo Neg 08/2021.  BMI 26.09 stable.  Good fitness, swimming. Healthy nutrition. Bone Density Osteopenia stable T-Score -1.3 at Rt Fem Neck in 08/2021.  Health labs with Fam MD.  Danielle [name] 2015.  2. Menopausal syndrome on hormone replacement therapy Postmenopause, well on HRT with Vivelle dot 0.05 and Prometrium 100 mg HS.  No PMB. No pelvic pain.  No pain with IC.  No CI to continue on HRT.  Prescriptions sent to pharmacy. - estradiol (VIVELLE-DOT) 0.05 MG/24HR patch; APPLY 1 PATCH TOPICALLY TO THE SKIN 2 TIMES A WEEK -  progesterone (PROMETRIUM) 100 MG capsule; TAKE 1 CAPSULE(100 MG) BY MOUTH AT BEDTIME  3. Osteopenia of multiple sites  BMI 26.09 stable.  Good fitness, swimming. Healthy nutrition. Bone Density Osteopenia stable T-Score -1.3 at Rt Fem Neck in 08/2021.  Repeat BD at 3 years.  Danielle Bruins MD, 12:24 PM

## 2022-09-10 ENCOUNTER — Ambulatory Visit
Admission: RE | Admit: 2022-09-10 | Discharge: 2022-09-10 | Disposition: A | Discharge status: Home / Self Care | Payer: Federal, State, Local not specified - PPO | Source: Ambulatory Visit | Attending: Internal Medicine | Admitting: Internal Medicine

## 2022-09-10 DIAGNOSIS — Z1231 Encounter for screening mammogram for malignant neoplasm of breast: Secondary | ICD-10-CM | POA: Diagnosis not present

## 2022-11-22 ENCOUNTER — Encounter: Payer: Self-pay | Admitting: Obstetrics & Gynecology

## 2023-08-06 ENCOUNTER — Other Ambulatory Visit: Payer: Self-pay | Admitting: Internal Medicine

## 2023-08-06 DIAGNOSIS — Z1231 Encounter for screening mammogram for malignant neoplasm of breast: Secondary | ICD-10-CM

## 2023-08-22 DIAGNOSIS — E559 Vitamin D deficiency, unspecified: Secondary | ICD-10-CM | POA: Diagnosis not present

## 2023-08-22 DIAGNOSIS — Z79890 Hormone replacement therapy (postmenopausal): Secondary | ICD-10-CM | POA: Diagnosis not present

## 2023-08-22 DIAGNOSIS — Z0000 Encounter for general adult medical examination without abnormal findings: Secondary | ICD-10-CM | POA: Diagnosis not present

## 2023-08-22 DIAGNOSIS — E7800 Pure hypercholesterolemia, unspecified: Secondary | ICD-10-CM | POA: Diagnosis not present

## 2023-08-22 DIAGNOSIS — Z23 Encounter for immunization: Secondary | ICD-10-CM | POA: Diagnosis not present

## 2023-08-22 DIAGNOSIS — R7989 Other specified abnormal findings of blood chemistry: Secondary | ICD-10-CM | POA: Diagnosis not present

## 2023-08-22 IMAGING — MG MM DIGITAL SCREENING BILAT W/ TOMO AND CAD
8 series · 9 of 24 positions shown · non-contrast
Comparison: Previous exam(s).

CLINICAL DATA: Screening.

EXAM:
DIGITAL SCREENING BILATERAL MAMMOGRAM WITH TOMOSYNTHESIS AND CAD
TECHNIQUE: Bilateral screening digital craniocaudal and mediolateral oblique
mammograms were obtained. Bilateral screening digital breast
tomosynthesis was performed. The images were evaluated with
computer-aided detection.

[R CC synth-2D]
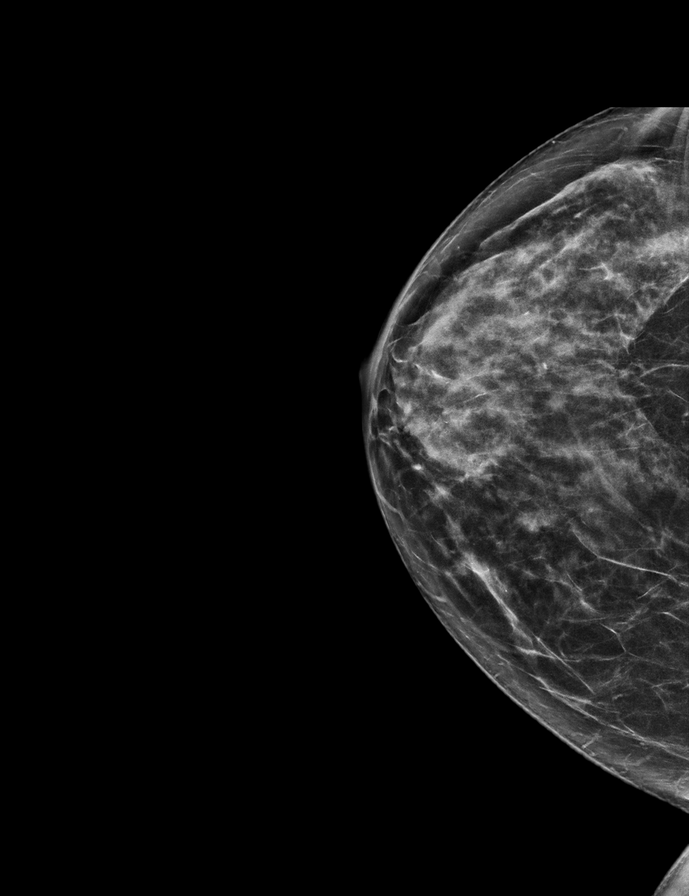

[L MLO synth-2D]
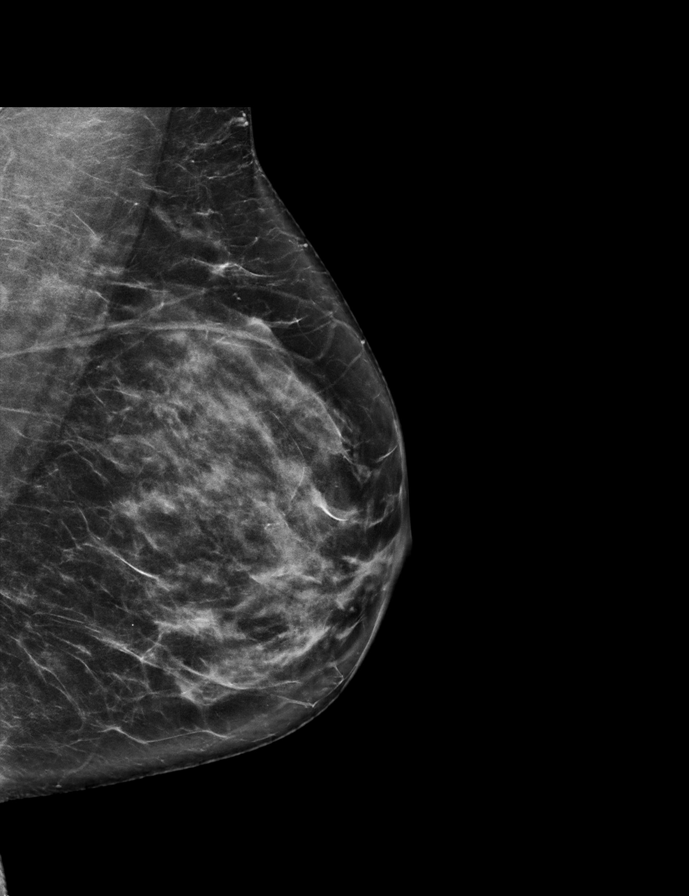

[L CC synth-2D]
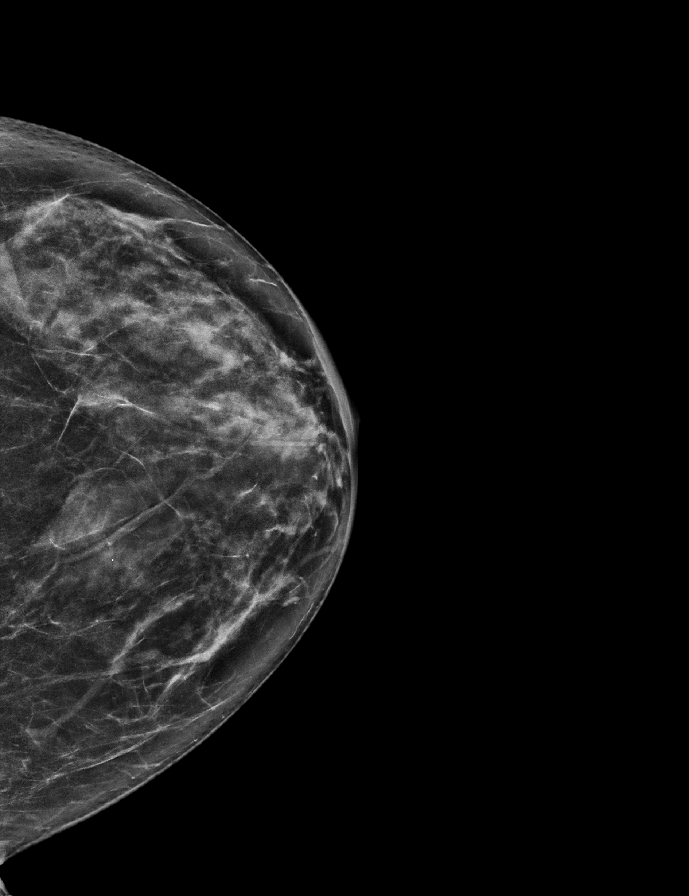

[R MLO synth-2D]
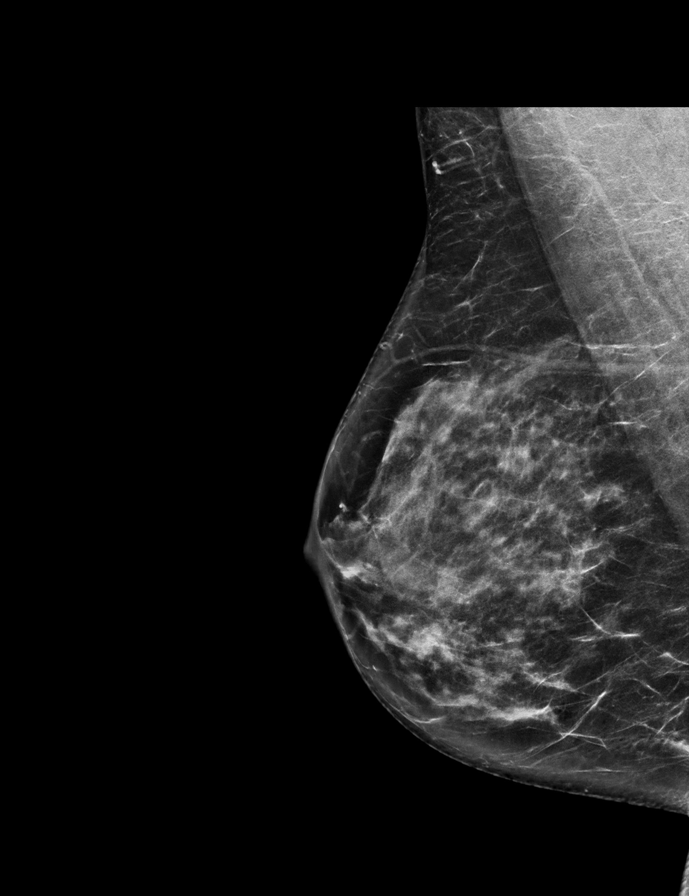

[R CC tomo · 2 of 62 frames shown]
[frame 21/62]
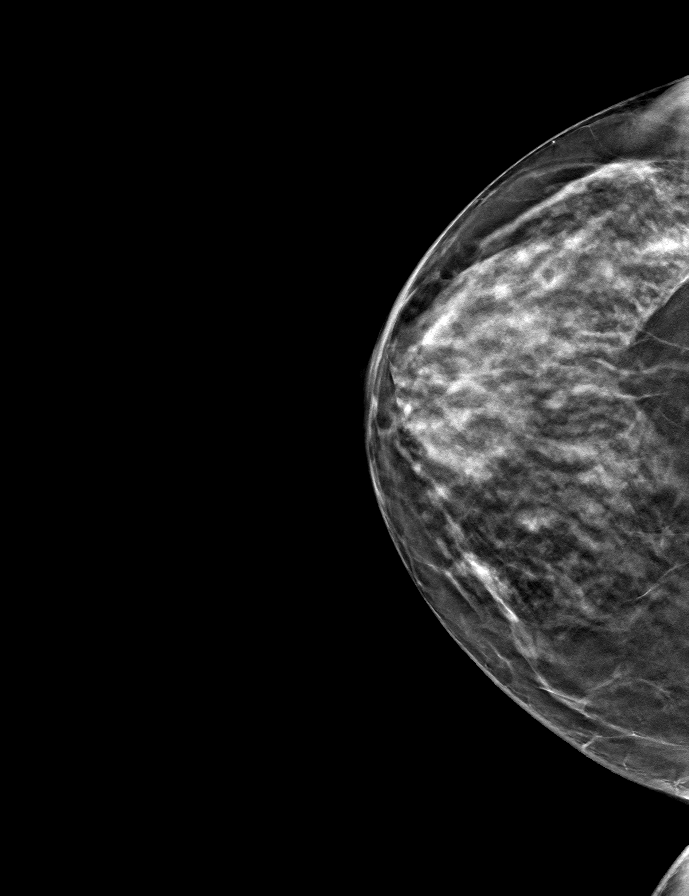
[frame 31/62]
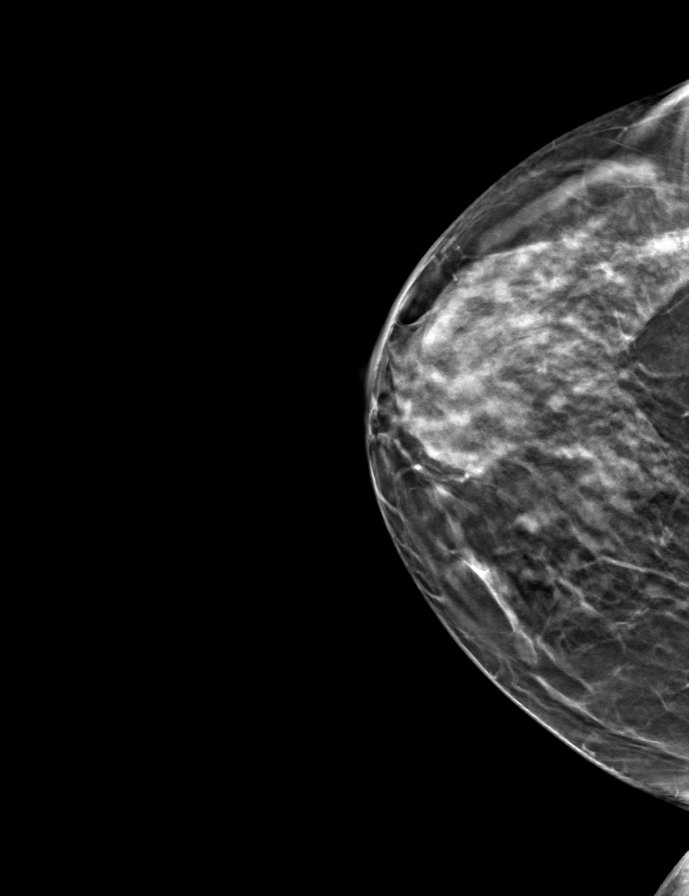

[R MLO tomo · tomo slice 35/69.0]
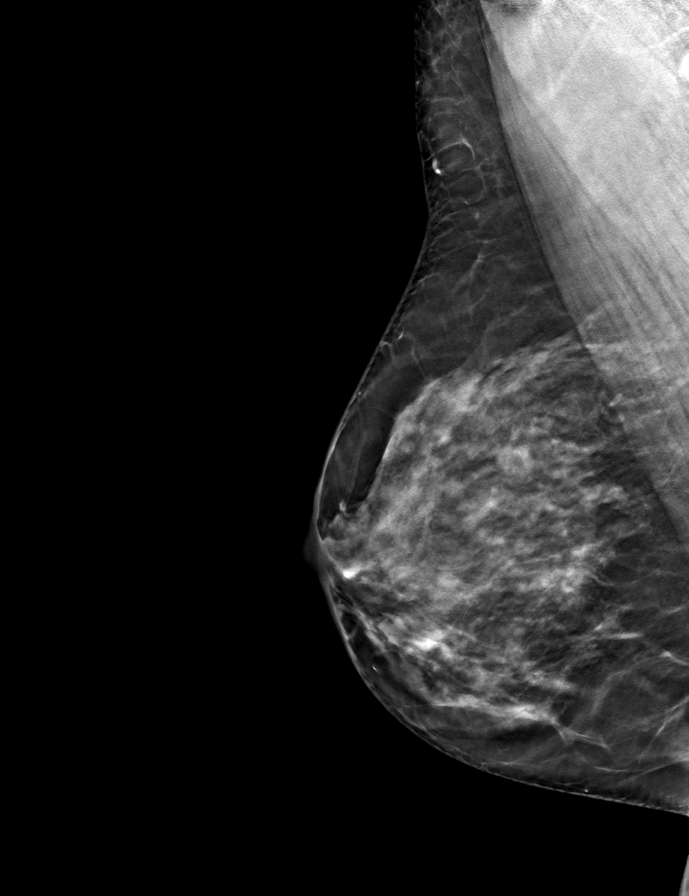

[L CC tomo · tomo slice 33/64.0]
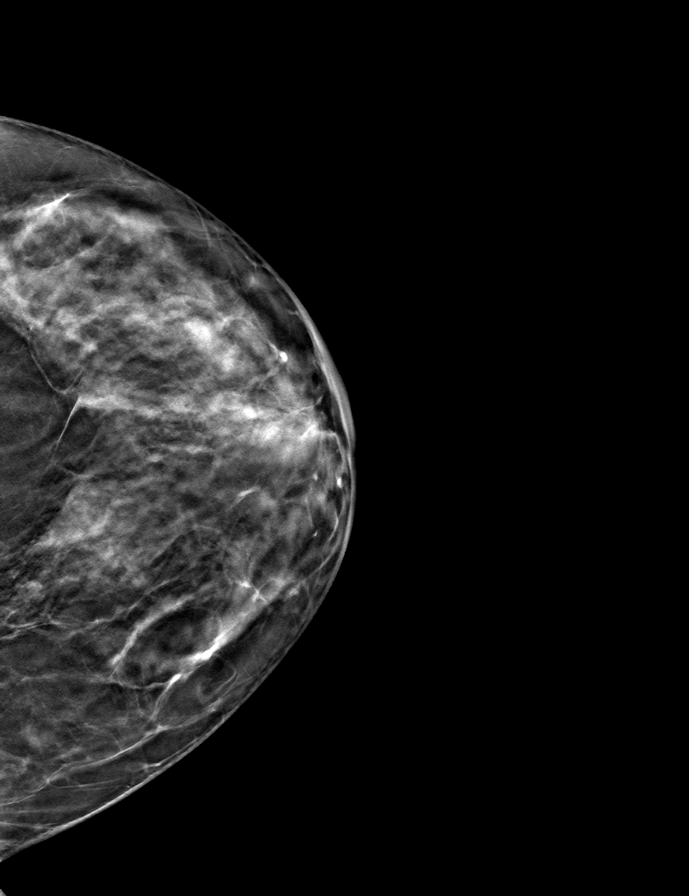

[L MLO tomo · tomo slice 36/71.0]
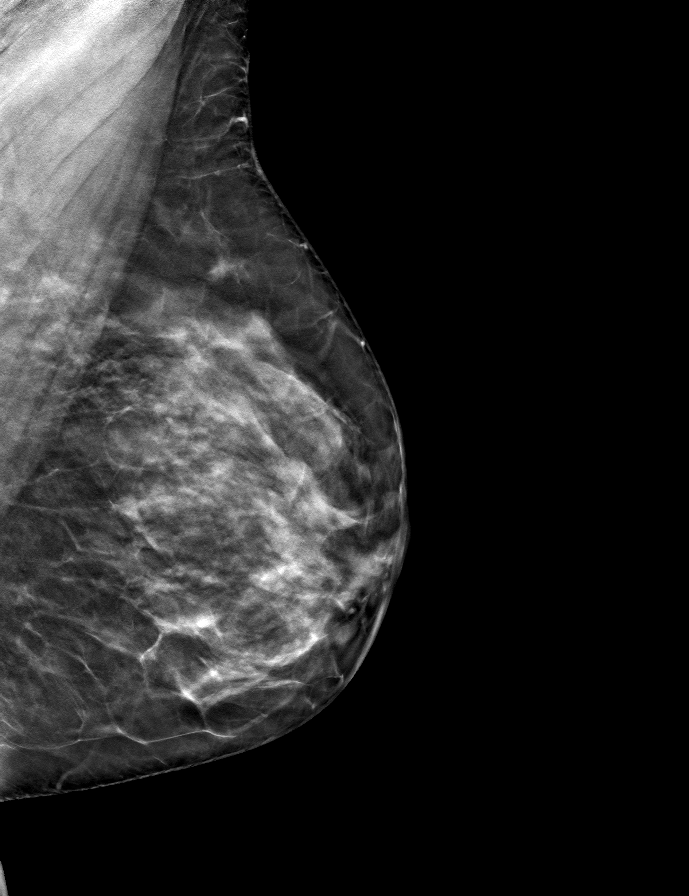

[9 of 24 positions shown; findings below may reference images not displayed]

ACR Breast Density Category c: The breast tissue is heterogeneously
dense, which may obscure small masses.
FINDINGS: There are no findings suspicious for malignancy.
IMPRESSION: No mammographic evidence of malignancy. A result letter of this
screening mammogram will be mailed directly to the patient.

RECOMMENDATION:
Screening mammogram in one year. (Code:Q3-W-BC3)

BI-RADS CATEGORY  1: Negative.

## 2023-09-08 DIAGNOSIS — Z01419 Encounter for gynecological examination (general) (routine) without abnormal findings: Secondary | ICD-10-CM | POA: Diagnosis not present

## 2023-09-08 DIAGNOSIS — Z1330 Encounter for screening examination for mental health and behavioral disorders, unspecified: Secondary | ICD-10-CM | POA: Diagnosis not present

## 2023-09-08 DIAGNOSIS — Z79890 Hormone replacement therapy (postmenopausal): Secondary | ICD-10-CM | POA: Diagnosis not present

## 2023-09-08 DIAGNOSIS — Z124 Encounter for screening for malignant neoplasm of cervix: Secondary | ICD-10-CM | POA: Diagnosis not present

## 2023-09-08 DIAGNOSIS — N393 Stress incontinence (female) (male): Secondary | ICD-10-CM | POA: Diagnosis not present

## 2023-09-12 ENCOUNTER — Ambulatory Visit
Admission: RE | Admit: 2023-09-12 | Discharge: 2023-09-12 | Disposition: A | Discharge status: Home / Self Care | Payer: Federal, State, Local not specified - PPO | Source: Ambulatory Visit | Attending: Internal Medicine | Admitting: Internal Medicine

## 2023-09-12 DIAGNOSIS — Z1231 Encounter for screening mammogram for malignant neoplasm of breast: Secondary | ICD-10-CM | POA: Diagnosis not present

## 2023-10-03 DIAGNOSIS — Z780 Asymptomatic menopausal state: Secondary | ICD-10-CM | POA: Diagnosis not present

## 2023-10-03 DIAGNOSIS — Z13820 Encounter for screening for osteoporosis: Secondary | ICD-10-CM | POA: Diagnosis not present

## 2024-08-04 ENCOUNTER — Other Ambulatory Visit: Payer: Self-pay | Admitting: Internal Medicine

## 2024-08-04 DIAGNOSIS — Z1231 Encounter for screening mammogram for malignant neoplasm of breast: Secondary | ICD-10-CM

## 2024-09-17 ENCOUNTER — Ambulatory Visit
